# Patient Record
Sex: Male | Born: 1950
Health system: Southern US, Community
[De-identification: ages and names within clinical notes are randomized; demographics above are authoritative.]

## PROBLEM LIST (undated history)

## (undated) DIAGNOSIS — E785 Hyperlipidemia, unspecified: Secondary | ICD-10-CM

## (undated) DIAGNOSIS — R531 Weakness: Secondary | ICD-10-CM

## (undated) DIAGNOSIS — I739 Peripheral vascular disease, unspecified: Secondary | ICD-10-CM

## (undated) DIAGNOSIS — M1990 Unspecified osteoarthritis, unspecified site: Secondary | ICD-10-CM

## (undated) DIAGNOSIS — E119 Type 2 diabetes mellitus without complications: Secondary | ICD-10-CM

## (undated) DIAGNOSIS — I6529 Occlusion and stenosis of unspecified carotid artery: Secondary | ICD-10-CM

## (undated) DIAGNOSIS — F419 Anxiety disorder, unspecified: Secondary | ICD-10-CM

## (undated) DIAGNOSIS — M542 Cervicalgia: Secondary | ICD-10-CM

## (undated) DIAGNOSIS — I10 Essential (primary) hypertension: Secondary | ICD-10-CM

## (undated) DIAGNOSIS — R7303 Prediabetes: Secondary | ICD-10-CM

## (undated) DIAGNOSIS — I6522 Occlusion and stenosis of left carotid artery: Secondary | ICD-10-CM

## (undated) HISTORY — DX: Hyperlipidemia, unspecified: E78.5

## (undated) HISTORY — PX: BACK SURGERY: SHX140

## (undated) HISTORY — PX: INGUINAL HERNIA REPAIR: SUR1180

## (undated) HISTORY — DX: Essential (primary) hypertension: I10

## (undated) HISTORY — DX: Occlusion and stenosis of unspecified carotid artery: I65.29

---

## 2000-07-16 ENCOUNTER — Encounter: Admission: RE | Admit: 2000-07-16 | Discharge: 2000-10-14 | Payer: Self-pay | Admitting: Internal Medicine

## 2005-02-09 ENCOUNTER — Ambulatory Visit (HOSPITAL_COMMUNITY): Admission: RE | Admit: 2005-02-09 | Discharge: 2005-02-09 | Payer: Self-pay | Admitting: Surgery

## 2005-02-09 ENCOUNTER — Ambulatory Visit (HOSPITAL_BASED_OUTPATIENT_CLINIC_OR_DEPARTMENT_OTHER): Admission: RE | Admit: 2005-02-09 | Discharge: 2005-02-09 | Payer: Self-pay | Admitting: Surgery

## 2009-09-29 ENCOUNTER — Encounter: Payer: Self-pay | Admitting: Cardiovascular Disease

## 2010-06-19 ENCOUNTER — Encounter: Payer: Self-pay | Admitting: Orthopedic Surgery

## 2010-10-14 NOTE — Op Note (Signed)
NAMECRIMSON, BEER               ACCOUNT NO.:  [id number]   MEDICAL RECORD NO.:  [id number]          PATIENT TYPE:  AMB   LOCATION:  DSC                          FACILITY:  [facility]   PHYSICIAN:  Currie Paris, M.D.DATE OF BIRTH:  1951-01-01   DATE OF PROCEDURE:  02/09/2005  DATE OF DISCHARGE:                                 OPERATIVE REPORT   OFFICE MEDICAL RECORD NUMBER:  [id number].   PREOPERATIVE DIAGNOSIS:  Left inguinal hernia.   POSTOPERATIVE DIAGNOSIS:  Left inguinal hernia.   OPERATION:  Repair of left inguinal hernia.   SURGEON:  Currie Paris, M.D.   ANESTHESIA:  MAC.   CLINICAL HISTORY:  This is a 59 year old gentleman who has presented with a  symptomatic left inguinal hernia and desired to have this repaired.   DESCRIPTION OF PROCEDURE:  The patient was seen in the holding area and had  no further questions.  The left inguinal area was marked by the patient and  by myself as the operative site.   The patient was taken to operating room and he had been given IV sedation.  The left groin area was clipped, prepped and draped.  The time-out occurred.   I infiltrated a combination of 0.05% plain Marcaine and 1% Xylocaine with  epinephrine.  We infiltrated the incision line and then below the fascia  near the anterosuperior iliac spine.   The incision was made obliquely and taken down to the external oblique with  additional local infiltrate as we got to the deeper layers.   The external oblique was opened in the line of its fibers into the  superficial ring.  The ilioinguinal nerve was identified.  The cord was  dissected up off the inguinal floor and the nerve kept with the cord  structures.   There was no direct defect present, although the floor was attenuated.  There was a sac which was readily identified in the anteromedial aspect of  the cord.  This was teased out of the cord and opened and stripped down to  the base.  There is a small piece of  small bowel stuck out in this which I  reduced.  The hernia sac was suture-closed with a pursestring of 2-0 silk  and reduced neatly under the deep ring.   I then put a Marlex mesh patch on.  This was sutured on starting to overlap  the pubic tubercle and running along from medial-to-lateral along the  inferior aspect of the transversalis and shelving edge.  The mesh was split  to go around the cord.  It was tacked medially onto the internal oblique.  The tails were crossed where it went around the deep ring and tacked  together.   This appeared to produce a nice repair with no tension.  Everything appeared  to be dry.   The incision was closed with 3-0 Vicryl on the external oblique, 3-0 Vicryl  on Scarpa's, and 4-0 Monocryl subcuticular plus Dermabond.  The patient  tolerated the procedure well.  There were no operative complications.  All  counts were correct.  Currie Paris, M.D.  Electronically Signed     CJS/MEDQ  D:  02/09/2005  T:  02/10/2005  Job:  [id number]

## 2011-10-25 ENCOUNTER — Encounter: Payer: Self-pay | Admitting: *Deleted

## 2013-06-18 ENCOUNTER — Other Ambulatory Visit: Payer: Self-pay | Admitting: Neurological Surgery

## 2013-06-25 ENCOUNTER — Encounter (HOSPITAL_COMMUNITY): Payer: Self-pay | Admitting: Pharmacy Technician

## 2013-07-01 ENCOUNTER — Encounter (HOSPITAL_COMMUNITY)
Admission: RE | Admit: 2013-07-01 | Discharge: 2013-07-01 | Disposition: A | Discharge status: Home / Self Care | Payer: 59 | Source: Ambulatory Visit | Attending: Neurological Surgery | Admitting: Neurological Surgery

## 2013-07-01 ENCOUNTER — Encounter (HOSPITAL_COMMUNITY): Payer: Self-pay

## 2013-07-01 HISTORY — DX: Unspecified osteoarthritis, unspecified site: M19.90

## 2013-07-01 HISTORY — DX: Anxiety disorder, unspecified: F41.9

## 2013-07-01 HISTORY — DX: Weakness: R53.1

## 2013-07-01 HISTORY — DX: Cervicalgia: M54.2

## 2013-07-01 LAB — CBC WITH DIFFERENTIAL/PLATELET
Basophils Absolute: 0 10*3/uL (ref 0.0–0.1)
Basophils Relative: 0 % (ref 0–1)
EOS ABS: 0.1 10*3/uL (ref 0.0–0.7)
EOS PCT: 2 % (ref 0–5)
HEMATOCRIT: 38.8 % — AB (ref 39.0–52.0)
Hemoglobin: 13.8 g/dL (ref 13.0–17.0)
LYMPHS ABS: 2.9 10*3/uL (ref 0.7–4.0)
Lymphocytes Relative: 50 % — ABNORMAL HIGH (ref 12–46)
MCH: 30.5 pg (ref 26.0–34.0)
MCHC: 35.6 g/dL (ref 30.0–36.0)
MCV: 85.7 fL (ref 78.0–100.0)
MONO ABS: 0.3 10*3/uL (ref 0.1–1.0)
MONOS PCT: 6 % (ref 3–12)
Neutro Abs: 2.5 10*3/uL (ref 1.7–7.7)
Neutrophils Relative %: 43 % (ref 43–77)
Platelets: 201 10*3/uL (ref 150–400)
RBC: 4.53 MIL/uL (ref 4.22–5.81)
RDW: 12.6 % (ref 11.5–15.5)
WBC: 5.9 10*3/uL (ref 4.0–10.5)

## 2013-07-01 LAB — COMPREHENSIVE METABOLIC PANEL
ALT: 23 U/L (ref 0–53)
AST: 24 U/L (ref 0–37)
Albumin: 4.3 g/dL (ref 3.5–5.2)
Alkaline Phosphatase: 57 U/L (ref 39–117)
BUN: 17 mg/dL (ref 6–23)
CALCIUM: 9.7 mg/dL (ref 8.4–10.5)
CO2: 23 meq/L (ref 19–32)
CREATININE: 0.72 mg/dL (ref 0.50–1.35)
Chloride: 100 mEq/L (ref 96–112)
GFR calc Af Amer: >90 mL/min (ref >90)
GFR calc non Af Amer: >90 mL/min (ref >90)
GLUCOSE: 101 mg/dL — AB (ref 70–99)
Potassium: 3.9 mEq/L (ref 3.7–5.3)
SODIUM: 138 meq/L (ref 137–147)
TOTAL PROTEIN: 7.4 g/dL (ref 6.0–8.3)
Total Bilirubin: 0.3 mg/dL (ref 0.3–1.2)

## 2013-07-01 LAB — SURGICAL PCR SCREEN
MRSA, PCR: NEGATIVE
Staphylococcus aureus: NEGATIVE

## 2013-07-01 NOTE — Progress Notes (Addendum)
Pt doesn't have a cardiologist  Stress test report in epic under encounter with [facility]  Denies ever having a heart cath  Medical Md is Dr.John Laurann Montana  Denies EKG or CXR in past yr   Echo report in epic from 2011

## 2013-07-01 NOTE — Pre-Procedure Instructions (Signed)
EAN GETTEL  07/01/2013   Your procedure is scheduled on:  Fri, Feb 6 @ 11:20 AM  Report to [facility]  at 9:15 AM.  Call this number if you have problems the morning of surgery: [phone]   Remember:   Do not eat food or drink liquids after midnight.      Do not wear jewelry, make-up or nail polish.  Do not wear lotions, powders, or perfumes. You may wear deodorant.  Do not shave 48 hours prior to surgery. Men may shave face and neck.  Do not bring valuables to the hospital.  [facility] is not responsible                  for any belongings or valuables.               Contacts, dentures or bridgework may not be worn into surgery.  Leave suitcase in the car. After surgery it may be brought to your room.  For patients admitted to the hospital, discharge time is determined by your                treatment team.               Patients discharged the day of surgery will not be allowed to drive  home.    Special Instructions: Shower using CHG 2 nights before surgery and the night before surgery.  If you shower the day of surgery use CHG.  Use special wash - you have one bottle of CHG for all showers.  You should use approximately 1/3 of the bottle for each shower.   Please read over the following fact sheets that you were given: Pain Booklet, Coughing and Deep Breathing, MRSA Information and Surgical Site Infection Prevention

## 2013-07-02 MED ORDER — CEFAZOLIN SODIUM-DEXTROSE 2-3 GM-% IV SOLR
2.0000 g | INTRAVENOUS | Status: AC
  Administered 2013-07-03: 2 g via INTRAVENOUS
  Filled 2013-07-02: qty 50

## 2013-07-02 MED ORDER — DEXAMETHASONE SODIUM PHOSPHATE 10 MG/ML IJ SOLN
10.0000 mg | INTRAMUSCULAR | Status: AC
  Administered 2013-07-03: 10 mg via INTRAVENOUS
  Filled 2013-07-02: qty 1

## 2013-07-03 ENCOUNTER — Ambulatory Visit (HOSPITAL_COMMUNITY): Payer: 59

## 2013-07-03 ENCOUNTER — Encounter (HOSPITAL_COMMUNITY)
Admission: RE | Disposition: A | Discharge status: Home / Self Care | Payer: Self-pay | Source: Ambulatory Visit | Attending: Neurological Surgery

## 2013-07-03 ENCOUNTER — Encounter (HOSPITAL_COMMUNITY): Payer: Self-pay | Admitting: Certified Registered Nurse Anesthetist

## 2013-07-03 ENCOUNTER — Ambulatory Visit (HOSPITAL_COMMUNITY)
Admission: RE | Admit: 2013-07-03 | Discharge: 2013-07-04 | Disposition: A | Discharge status: Home / Self Care | Payer: 59 | Source: Ambulatory Visit | Attending: Neurological Surgery | Admitting: Neurological Surgery

## 2013-07-03 ENCOUNTER — Ambulatory Visit (HOSPITAL_COMMUNITY): Payer: 59 | Admitting: Certified Registered Nurse Anesthetist

## 2013-07-03 ENCOUNTER — Encounter (HOSPITAL_COMMUNITY): Payer: 59 | Admitting: Certified Registered Nurse Anesthetist

## 2013-07-03 DIAGNOSIS — E119 Type 2 diabetes mellitus without complications: Secondary | ICD-10-CM | POA: Insufficient documentation

## 2013-07-03 DIAGNOSIS — Z981 Arthrodesis status: Secondary | ICD-10-CM

## 2013-07-03 DIAGNOSIS — F411 Generalized anxiety disorder: Secondary | ICD-10-CM | POA: Insufficient documentation

## 2013-07-03 DIAGNOSIS — E785 Hyperlipidemia, unspecified: Secondary | ICD-10-CM | POA: Insufficient documentation

## 2013-07-03 DIAGNOSIS — Z87891 Personal history of nicotine dependence: Secondary | ICD-10-CM | POA: Insufficient documentation

## 2013-07-03 DIAGNOSIS — I10 Essential (primary) hypertension: Secondary | ICD-10-CM | POA: Insufficient documentation

## 2013-07-03 DIAGNOSIS — M4802 Spinal stenosis, cervical region: Secondary | ICD-10-CM | POA: Insufficient documentation

## 2013-07-03 DIAGNOSIS — M4712 Other spondylosis with myelopathy, cervical region: Secondary | ICD-10-CM | POA: Insufficient documentation

## 2013-07-03 HISTORY — PX: ANTERIOR CERVICAL CORPECTOMY: SHX1159

## 2013-07-03 LAB — GLUCOSE, CAPILLARY
Glucose-Capillary: 146 mg/dL — ABNORMAL HIGH (ref 70–99)
Glucose-Capillary: 199 mg/dL — ABNORMAL HIGH (ref 70–99)

## 2013-07-03 SURGERY — ANTERIOR CERVICAL CORPECTOMY
Anesthesia: General

## 2013-07-03 MED ORDER — ROCURONIUM BROMIDE 100 MG/10ML IV SOLN
INTRAVENOUS | Status: DC | PRN
  Administered 2013-07-03: 50 mg via INTRAVENOUS

## 2013-07-03 MED ORDER — THROMBIN 20000 UNITS EX SOLR
CUTANEOUS | Status: DC | PRN
  Administered 2013-07-03: 13:00:00 via TOPICAL

## 2013-07-03 MED ORDER — BUPIVACAINE HCL (PF) 0.25 % IJ SOLN
INTRAMUSCULAR | Status: DC | PRN
  Administered 2013-07-03: 3 mL

## 2013-07-03 MED ORDER — 0.9 % SODIUM CHLORIDE (POUR BTL) OPTIME
TOPICAL | Status: DC | PRN
  Administered 2013-07-03: 1000 mL

## 2013-07-03 MED ORDER — POTASSIUM CHLORIDE IN NACL 20-0.9 MEQ/L-% IV SOLN
INTRAVENOUS | Status: DC
  Filled 2013-07-03 (×3): qty 1000

## 2013-07-03 MED ORDER — FENTANYL CITRATE 0.05 MG/ML IJ SOLN
INTRAMUSCULAR | Status: AC
  Filled 2013-07-03: qty 5

## 2013-07-03 MED ORDER — SODIUM CHLORIDE 0.9 % IJ SOLN
3.0000 mL | Freq: Two times a day (BID) | INTRAMUSCULAR | Status: DC
  Administered 2013-07-03: 3 mL via INTRAVENOUS

## 2013-07-03 MED ORDER — PHENOL 1.4 % MT LIQD
1.0000 | OROMUCOSAL | Status: DC | PRN
  Filled 2013-07-03: qty 177

## 2013-07-03 MED ORDER — MENTHOL 3 MG MT LOZG: 1.0000 | LOZENGE | OROMUCOSAL | Status: DC | PRN

## 2013-07-03 MED ORDER — LIDOCAINE HCL (CARDIAC) 20 MG/ML IV SOLN
INTRAVENOUS | Status: AC
  Filled 2013-07-03: qty 10

## 2013-07-03 MED ORDER — LIDOCAINE HCL 4 % MT SOLN
OROMUCOSAL | Status: DC | PRN
  Administered 2013-07-03: 4 mL via TOPICAL

## 2013-07-03 MED ORDER — GLYCOPYRROLATE 0.2 MG/ML IJ SOLN
INTRAMUSCULAR | Status: AC
  Filled 2013-07-03: qty 2

## 2013-07-03 MED ORDER — SODIUM CHLORIDE 0.9 % IJ SOLN: 3.0000 mL | INTRAMUSCULAR | Status: DC | PRN

## 2013-07-03 MED ORDER — FENTANYL CITRATE 0.05 MG/ML IJ SOLN
INTRAMUSCULAR | Status: DC | PRN
  Administered 2013-07-03: 50 ug via INTRAVENOUS
  Administered 2013-07-03: 100 ug via INTRAVENOUS
  Administered 2013-07-03 (×3): 50 ug via INTRAVENOUS
  Administered 2013-07-03: 150 ug via INTRAVENOUS
  Administered 2013-07-03: 50 ug via INTRAVENOUS

## 2013-07-03 MED ORDER — ONDANSETRON HCL 4 MG/2ML IJ SOLN: 4.0000 mg | Freq: Once | INTRAMUSCULAR | Status: DC | PRN

## 2013-07-03 MED ORDER — IRBESARTAN 150 MG PO TABS
150.0000 mg | ORAL_TABLET | Freq: Every day | ORAL | Status: DC
  Administered 2013-07-03: 150 mg via ORAL
  Filled 2013-07-03 (×3): qty 1

## 2013-07-03 MED ORDER — LIDOCAINE HCL (CARDIAC) 20 MG/ML IV SOLN
INTRAVENOUS | Status: DC | PRN
Start: 2013-07-03 — End: 2013-07-03
  Administered 2013-07-03: 100 mg via INTRAVENOUS

## 2013-07-03 MED ORDER — ARTIFICIAL TEARS OP OINT
TOPICAL_OINTMENT | OPHTHALMIC | Status: DC | PRN
  Administered 2013-07-03: 1 via OPHTHALMIC

## 2013-07-03 MED ORDER — ACETAMINOPHEN 325 MG PO TABS: 650.0000 mg | ORAL_TABLET | ORAL | Status: DC | PRN

## 2013-07-03 MED ORDER — GLYCOPYRROLATE 0.2 MG/ML IJ SOLN
INTRAMUSCULAR | Status: DC | PRN
  Administered 2013-07-03: 0.4 mg via INTRAVENOUS

## 2013-07-03 MED ORDER — ARTIFICIAL TEARS OP OINT
TOPICAL_OINTMENT | OPHTHALMIC | Status: AC
  Filled 2013-07-03: qty 3.5

## 2013-07-03 MED ORDER — SODIUM CHLORIDE 0.9 % IR SOLN
Status: DC | PRN
  Administered 2013-07-03: 13:00:00

## 2013-07-03 MED ORDER — DEXAMETHASONE SODIUM PHOSPHATE 4 MG/ML IJ SOLN
4.0000 mg | Freq: Four times a day (QID) | INTRAMUSCULAR | Status: DC
  Filled 2013-07-03 (×4): qty 1

## 2013-07-03 MED ORDER — ROCURONIUM BROMIDE 50 MG/5ML IV SOLN
INTRAVENOUS | Status: AC
  Filled 2013-07-03: qty 1

## 2013-07-03 MED ORDER — DEXAMETHASONE 4 MG PO TABS
4.0000 mg | ORAL_TABLET | Freq: Four times a day (QID) | ORAL | Status: DC
  Administered 2013-07-03 – 2013-07-04 (×3): 4 mg via ORAL
  Filled 2013-07-03 (×5): qty 1

## 2013-07-03 MED ORDER — LACTATED RINGERS IV SOLN
INTRAVENOUS | Status: DC
  Administered 2013-07-03: 50 mL/h via INTRAVENOUS
  Administered 2013-07-03: 13:00:00 via INTRAVENOUS

## 2013-07-03 MED ORDER — ONDANSETRON HCL 4 MG/2ML IJ SOLN: 4.0000 mg | INTRAMUSCULAR | Status: DC | PRN

## 2013-07-03 MED ORDER — PHENYLEPHRINE HCL 10 MG/ML IJ SOLN
INTRAMUSCULAR | Status: DC | PRN
  Administered 2013-07-03 (×5): 40 ug via INTRAVENOUS

## 2013-07-03 MED ORDER — NEOSTIGMINE METHYLSULFATE 1 MG/ML IJ SOLN
INTRAMUSCULAR | Status: DC | PRN
  Administered 2013-07-03: 3 mg via INTRAVENOUS

## 2013-07-03 MED ORDER — ALBUMIN HUMAN 5 % IV SOLN
INTRAVENOUS | Status: DC | PRN
  Administered 2013-07-03: 14:00:00 via INTRAVENOUS

## 2013-07-03 MED ORDER — PROPOFOL 10 MG/ML IV BOLUS
INTRAVENOUS | Status: AC
  Filled 2013-07-03: qty 20

## 2013-07-03 MED ORDER — MORPHINE SULFATE 2 MG/ML IJ SOLN
1.0000 mg | INTRAMUSCULAR | Status: DC | PRN
  Administered 2013-07-03: 2 mg via INTRAVENOUS
  Filled 2013-07-03: qty 1

## 2013-07-03 MED ORDER — OLMESARTAN MEDOXOMIL-HCTZ 20-12.5 MG PO TABS: 1.0000 | ORAL_TABLET | Freq: Every day | ORAL | Status: DC

## 2013-07-03 MED ORDER — THROMBIN 5000 UNITS EX SOLR
OROMUCOSAL | Status: DC | PRN
  Administered 2013-07-03: 13:00:00 via TOPICAL

## 2013-07-03 MED ORDER — ONDANSETRON HCL 4 MG/2ML IJ SOLN
INTRAMUSCULAR | Status: DC | PRN
  Administered 2013-07-03: 4 mg via INTRAVENOUS

## 2013-07-03 MED ORDER — HYDROCODONE-ACETAMINOPHEN 5-325 MG PO TABS
1.0000 | ORAL_TABLET | ORAL | Status: DC | PRN
  Administered 2013-07-03 (×2): 2 via ORAL
  Filled 2013-07-03 (×2): qty 2

## 2013-07-03 MED ORDER — HYDROCHLOROTHIAZIDE 12.5 MG PO CAPS
12.5000 mg | ORAL_CAPSULE | Freq: Every day | ORAL | Status: DC
  Administered 2013-07-03: 12.5 mg via ORAL
  Filled 2013-07-03 (×2): qty 1

## 2013-07-03 MED ORDER — PROPOFOL 10 MG/ML IV BOLUS
INTRAVENOUS | Status: DC | PRN
  Administered 2013-07-03: 70 mg via INTRAVENOUS
  Administered 2013-07-03: 200 mg via INTRAVENOUS

## 2013-07-03 MED ORDER — MIDAZOLAM HCL 2 MG/2ML IJ SOLN
INTRAMUSCULAR | Status: AC
  Filled 2013-07-03: qty 2

## 2013-07-03 MED ORDER — METHOCARBAMOL 500 MG PO TABS
500.0000 mg | ORAL_TABLET | Freq: Four times a day (QID) | ORAL | Status: DC | PRN
  Administered 2013-07-03: 500 mg via ORAL
  Filled 2013-07-03: qty 1

## 2013-07-03 MED ORDER — CEFAZOLIN SODIUM 1-5 GM-% IV SOLN
1.0000 g | Freq: Three times a day (TID) | INTRAVENOUS | Status: AC
  Administered 2013-07-03 – 2013-07-04 (×2): 1 g via INTRAVENOUS
  Filled 2013-07-03 (×2): qty 50

## 2013-07-03 MED ORDER — METHOCARBAMOL 100 MG/ML IJ SOLN
500.0000 mg | Freq: Four times a day (QID) | INTRAVENOUS | Status: DC | PRN
  Filled 2013-07-03: qty 5

## 2013-07-03 MED ORDER — MIDAZOLAM HCL 5 MG/5ML IJ SOLN
INTRAMUSCULAR | Status: DC | PRN
  Administered 2013-07-03: 2 mg via INTRAVENOUS

## 2013-07-03 MED ORDER — ONDANSETRON HCL 4 MG/2ML IJ SOLN
INTRAMUSCULAR | Status: AC
  Filled 2013-07-03: qty 2

## 2013-07-03 MED ORDER — ACETAMINOPHEN 650 MG RE SUPP: 650.0000 mg | RECTAL | Status: DC | PRN

## 2013-07-03 MED ORDER — SODIUM CHLORIDE 0.9 % IV SOLN: 250.0000 mL | INTRAVENOUS | Status: DC

## 2013-07-03 MED ORDER — HYDROMORPHONE HCL PF 1 MG/ML IJ SOLN: 0.2500 mg | INTRAMUSCULAR | Status: DC | PRN

## 2013-07-03 SURGICAL SUPPLY — 53 items
BAG DECANTER FOR FLEXI CONT (MISCELLANEOUS) ×3 IMPLANT
BENZOIN TINCTURE PRP APPL 2/3 (GAUZE/BANDAGES/DRESSINGS) ×3 IMPLANT
BONE MATRIX OSTEOCEL PRO SM (Bone Implant) ×3 IMPLANT
BUR MATCHSTICK NEURO 3.0 LAGG (BURR) ×3 IMPLANT
CAGE LORDOTIC 8 SM PLUS (Cage) ×3 IMPLANT
CAGE SMALL 7X13X15 (Cage) ×3 IMPLANT
CANISTER SUCT 3000ML (MISCELLANEOUS) ×3 IMPLANT
CLOSURE WOUND 1/2 X4 (GAUZE/BANDAGES/DRESSINGS) ×1
CONT SPEC 4OZ CLIKSEAL STRL BL (MISCELLANEOUS) ×3 IMPLANT
DRAPE C-ARM 42X72 X-RAY (DRAPES) ×6 IMPLANT
DRAPE LAPAROTOMY 100X72 PEDS (DRAPES) ×3 IMPLANT
DRAPE MICROSCOPE ZEISS OPMI (DRAPES) ×3 IMPLANT
DRAPE POUCH INSTRU U-SHP 10X18 (DRAPES) ×3 IMPLANT
DRESSING TELFA 8X3 (GAUZE/BANDAGES/DRESSINGS) IMPLANT
DRILL BIT HELIX (BIT) ×3 IMPLANT
DRSG OPSITE 4X5.5 SM (GAUZE/BANDAGES/DRESSINGS) IMPLANT
DRSG OPSITE POSTOP 3X4 (GAUZE/BANDAGES/DRESSINGS) ×3 IMPLANT
DURAPREP 6ML APPLICATOR 50/CS (WOUND CARE) ×3 IMPLANT
ELECT COATED BLADE 2.86 ST (ELECTRODE) ×3 IMPLANT
ELECT REM PT RETURN 9FT ADLT (ELECTROSURGICAL) ×3
ELECTRODE REM PT RTRN 9FT ADLT (ELECTROSURGICAL) ×1 IMPLANT
GAUZE SPONGE 4X4 16PLY XRAY LF (GAUZE/BANDAGES/DRESSINGS) IMPLANT
GLOVE BIO SURGEON STRL SZ8 (GLOVE) ×3 IMPLANT
GLOVE BIO SURGEON STRL SZ8.5 (GLOVE) ×3 IMPLANT
GLOVE BIOGEL PI IND STRL 7.0 (GLOVE) ×1 IMPLANT
GLOVE BIOGEL PI INDICATOR 7.0 (GLOVE) ×2
GLOVE SS BIOGEL STRL SZ 8 (GLOVE) ×1 IMPLANT
GLOVE SUPERSENSE BIOGEL SZ 8 (GLOVE) ×2
GLOVE SURG SS PI 7.0 STRL IVOR (GLOVE) ×6 IMPLANT
GOWN BRE IMP SLV AUR LG STRL (GOWN DISPOSABLE) IMPLANT
GOWN BRE IMP SLV AUR XL STRL (GOWN DISPOSABLE) ×6 IMPLANT
GOWN STRL REIN 2XL LVL4 (GOWN DISPOSABLE) IMPLANT
HEMOSTAT POWDER KIT SURGIFOAM (HEMOSTASIS) ×3 IMPLANT
KIT BASIN OR (CUSTOM PROCEDURE TRAY) ×3 IMPLANT
KIT ROOM TURNOVER OR (KITS) ×3 IMPLANT
NEEDLE HYPO 25X1 1.5 SAFETY (NEEDLE) ×3 IMPLANT
NEEDLE SPNL 20GX3.5 QUINCKE YW (NEEDLE) ×3 IMPLANT
NS IRRIG 1000ML POUR BTL (IV SOLUTION) ×3 IMPLANT
PACK LAMINECTOMY NEURO (CUSTOM PROCEDURE TRAY) ×3 IMPLANT
PAD ARMBOARD 7.5X6 YLW CONV (MISCELLANEOUS) ×9 IMPLANT
PLATE ARCHON 2-LEVEL 40MM (Plate) ×3 IMPLANT
RUBBERBAND STERILE (MISCELLANEOUS) ×6 IMPLANT
SCREW ARCHON SELFDRILL 4.0X13 (Screw) ×3 IMPLANT
SCREW ARCHON SELFTAP 4.0X13 (Screw) ×15 IMPLANT
SPONGE INTESTINAL PEANUT (DISPOSABLE) ×3 IMPLANT
SPONGE SURGIFOAM ABS GEL 100 (HEMOSTASIS) ×3 IMPLANT
STRIP CLOSURE SKIN 1/2X4 (GAUZE/BANDAGES/DRESSINGS) ×2 IMPLANT
SUT VIC AB 3-0 SH 8-18 (SUTURE) ×6 IMPLANT
SYR 20ML ECCENTRIC (SYRINGE) ×3 IMPLANT
TOWEL OR 17X24 6PK STRL BLUE (TOWEL DISPOSABLE) ×3 IMPLANT
TOWEL OR 17X26 10 PK STRL BLUE (TOWEL DISPOSABLE) ×3 IMPLANT
TRAP SPECIMEN MUCOUS 40CC (MISCELLANEOUS) ×3 IMPLANT
WATER STERILE IRR 1000ML POUR (IV SOLUTION) ×3 IMPLANT

## 2013-07-03 NOTE — Anesthesia Preprocedure Evaluation (Signed)
Anesthesia Evaluation  Patient identified by MRN, date of birth, ID band  Reviewed: Allergy & Precautions, H&P , NPO status , Patient's Chart, lab work & pertinent test results  Airway       Dental   Pulmonary former smoker,          Cardiovascular hypertension,     Neuro/Psych    GI/Hepatic   Endo/Other  diabetes, Well Controlled, Type 2  Renal/GU      Musculoskeletal   Abdominal   Peds  Hematology   Anesthesia Other Findings   Reproductive/Obstetrics                           Anesthesia Physical Anesthesia Plan  ASA: II  Anesthesia Plan: General   Post-op Pain Management:    Induction: Intravenous  Airway Management Planned: Oral ETT  Additional Equipment:   Intra-op Plan:   Post-operative Plan: Extubation in OR  Informed Consent: I have reviewed the patients History and Physical, chart, labs and discussed the procedure including the risks, benefits and alternatives for the proposed anesthesia with the patient or authorized representative who has indicated his/her understanding and acceptance.     Plan Discussed with:   Anesthesia Plan Comments:         Anesthesia Quick Evaluation

## 2013-07-03 NOTE — Transfer of Care (Signed)
Immediate Anesthesia Transfer of Care Note  Patient: Gregg Moon  Procedure(s) Performed: Procedure(s): C/5 Corpectomy w/strut graft + plating (N/A)  Patient Location: PACU  Anesthesia Type:General  Level of Consciousness: awake, alert , patient cooperative and responds to stimulation  Airway & Oxygen Therapy: Patient Spontanous Breathing and Patient connected to nasal cannula oxygen  Post-op Assessment: Report given to PACU RN, Post -op Vital signs reviewed and stable and Patient moving all extremities X 4  Post vital signs: Reviewed and stable  Complications: No apparent anesthesia complications

## 2013-07-03 NOTE — Anesthesia Postprocedure Evaluation (Signed)
Anesthesia Post Note  Patient: Gregg Moon  Procedure(s) Performed: Procedure(s) (LRB): C/5 Corpectomy w/strut graft + plating (N/A)  Anesthesia type: general  Patient location: PACU  Post pain: Pain level controlled  Post assessment: Patient's Cardiovascular Status Stable Post vital signs: Reviewed and stable  Level of consciousness: sedated  Complications: No apparent anesthesia complications

## 2013-07-03 NOTE — Preoperative (Signed)
Beta Blockers   Reason not to administer Beta Blockers:Not Applicable 

## 2013-07-03 NOTE — Op Note (Signed)
07/03/2013  3:04 PM  PATIENT:  Gregg Moon  63 y.o. male  PRE-OPERATIVE DIAGNOSIS:  Cervical spondylosis with severe cervical spinal stenosis and early myelopathy  POST-OPERATIVE DIAGNOSIS:  Same  PROCEDURE:  1. Decompressive anterior cervical discectomy C4-5 and C5-6, 2. Anterior cervical arthrodesis C4-5 and C5-6 utilizing peek interbody cages packed with local autograft and morcellized allograft, 3 anterior cervical plating C4-C6 utilizing an Nuvasive Archon plate  SURGEON:  Sherley Bounds, MD  ASSISTANTS: Dr. Arnoldo Morale  ANESTHESIA:   General  EBL: 150 ml  Total I/O In: 1850 [I.V.:1600; IV Piggyback:250] Out: 150 [Blood:150]  BLOOD ADMINISTERED:none  DRAINS: None   SPECIMEN:  No Specimen  INDICATION FOR PROCEDURE: This patient presented with neck pain and numbness in his left hand. MRI showed severe spinal stenosis with kyphosis at C4-5 and C5-6 with signal change in the spinal cord. I recommended either C5 corpectomy vs a two level discectomy at C4-5 and C5-6 in order to decompress the canal. He understood the risks benefits suspected outcomes and wished proceed prescription Patient understood the risks, benefits, and alternatives and potential outcomes and wished to proceed.  PROCEDURE DETAILS: Patient was brought to the operating room placed under general endotracheal anesthesia. Patient was placed in the supine position on the operating room table. The neck was prepped with Duraprep and draped in a sterile fashion.   Three cc of local anesthesia was injected and a transverse incision was made on the right side of the neck.  Dissection was carried down thru the subcutaneous tissue and the platysma was  elevated, opened, and undermined with Metzenbaum scissors.  Dissection was then carried out thru an avascular plane leaving the sternocleidomastoid carotid artery and jugular vein laterally and the trachea and esophagus medially. The ventral aspect of the vertebral column was  identified and a localizing x-ray was taken. The C5-6 level was identified. The longus colli muscles were then elevated and the retractor was placed expose C4-5 and C5-6. The annulus of C4-5 and C5-6 was incised and the disc space entered. Discectomy was performed with micro-curettes and pituitary rongeurs. I then used the high-speed drill to drill the endplates down to the level of the posterior longitudinal ligament. The drill shavings were saved in a mucous trap for later arthrodesis. The operating microscope was draped and brought into the field provided additional magnification, illumination and visualization. Discectomy was continued posteriorly thru the disc space at both levels. Posterior longitudinal ligament was opened with a nerve hook, and then removed along with disc herniation and osteophytes, decompressing the spinal canal and thecal sac. We then continued to remove osteophytic overgrowth and disc material decompressing the neural foramina and exiting nerve roots bilaterally. The scope was angled up and down to help decompress and undercut the vertebral bodies. I undercut the vertebral bodies as best I could and to the dura was full and relaxed all the way across and I could see the cord pulsatile through the dura. Once the decompression was completed we could pass a nerve hook circumferentially to assure adequate decompression in the midline and in the neural foramina. So by both visualization and palpation we felt we had an adequate decompression of the neural elements. We then measured the height of the intravertebral disc space and selected a 8 millimeter Peek interbody cage packed with autograft and morcellized allograft. It was then gently positioned in the intravertebral disc space and countersunk at both levels. I then used a Nuvasive archon plate and placed four variable angle screws into  the vertebral bodies and locked them into position. The wound was irrigated with bacitracin solution,  checked for hemostasis which was established and confirmed. Once meticulous hemostasis was achieved, we then proceeded with closure. The platysma was closed with interrupted 3-0 undyed Vicryl suture, the subcuticular layer was closed with interrupted 3-0 undyed Vicryl suture. The skin edges were approximated with steristrips. The drapes were removed. A sterile dressing was applied. The patient was then awakened from general anesthesia and transferred to the recovery room in stable condition. At the end of the procedure all sponge, needle and instrument counts were correct.   PLAN OF CARE: Admit for overnight observation  PATIENT DISPOSITION:  PACU - hemodynamically stable.   Delay start of Pharmacological VTE agent (>24hrs) due to surgical blood loss or risk of bleeding:  yes

## 2013-07-03 NOTE — Anesthesia Procedure Notes (Signed)
Procedure Name: Intubation Date/Time: 07/03/2013 12:28 PM Performed by: [place] Pink Pre-anesthesia Checklist: Patient identified, Timeout performed, Emergency Drugs available, Suction available and Patient being monitored Patient Re-evaluated:Patient Re-evaluated prior to inductionOxygen Delivery Method: Circle system utilized Preoxygenation: Pre-oxygenation with 100% oxygen Intubation Type: IV induction Ventilation: Mask ventilation without difficulty and Oral airway inserted - appropriate to patient size Laryngoscope Size: Mac, 3, Miller and 2 Grade View: Grade III Tube type: Oral Tube size: 7.5 mm Number of attempts: 2 Airway Equipment and Method: Stylet,  Bougie stylet and LTA kit utilized Placement Confirmation: ETT inserted through vocal cords under direct vision,  positive ETCO2 and breath sounds checked- equal and bilateral Secured at: 23 cm Tube secured with: Tape Dental Injury: Teeth and Oropharynx as per pre-operative assessment

## 2013-07-03 NOTE — H&P (Signed)
Subjective:   Patient is a 63 y.o. male admitted for C4-5 C5-6 ACDF versus corpectomy for early cervical spondylitic myelopathy for cervical spondylosis and stenosis. The patient first presented to me with complaints of neck pain. Onset of symptoms was several months ago. The pain is described as aching and occurs intermittently. The pain is rated moderate, and is located  at the base of the neck and radiates to the left arm. The symptoms have been progressive. Symptoms are exacerbated by extending head backwards, and are relieved by none.  Previous work up includes MRI of cervical spine, results: spinal stenosis.  Past Medical History  Diagnosis Date  . Hyperlipidemia     takes Zocor daily  . Hypertension     takes Benicar daily  . Neck pain     stenosis  . Weakness     numbness and tingling to the left arm  . Arthritis   . Diabetes mellitus     borderline  . Anxiety     was on meds but has been off for 2-3 months    Past Surgical History  Procedure Laterality Date  . Inguinal hernia repair      x 2    Allergies  Allergen Reactions  . Other     A blood pressure pill but unsure of name-gave bad headache    History  Substance Use Topics  . Smoking status: Former Research scientist (life sciences)  . Smokeless tobacco: Not on file     Comment: quit smoking 67yrs ago  . Alcohol Use: Yes     Comment: weekends    Family History  Problem Relation Age of Onset  . Heart disease Mother   . Diabetes Mother   . Heart disease Father   . Hypertension Sister   . Diabetes Sister    Prior to Admission medications   Medication Sig Start Date End Date Taking? Authorizing Provider  Naphazoline HCl (CLEAR EYES OP) Apply 1 drop to eye daily as needed (for itching).    Yes Historical Provider, MD  olmesartan-hydrochlorothiazide (BENICAR HCT) 20-12.5 MG per tablet Take 1 tablet by mouth daily.   Yes Historical Provider, MD  sildenafil (VIAGRA) 100 MG tablet Take 100 mg by mouth daily as needed for erectile  dysfunction.   Yes Historical Provider, MD  simvastatin (ZOCOR) 40 MG tablet Take 40 mg by mouth every evening.   Yes Historical Provider, MD     Review of Systems  Positive ROS: neg  All other systems have been reviewed and were otherwise negative with the exception of those mentioned in the HPI and as above.  Objective: Vital signs in last 24 hours: Temp:  [98.1 F (36.7 C)] 98.1 F (36.7 C) (02/05 0949) Pulse Rate:  [81] 81 (02/05 0949) Resp:  [20] 20 (02/05 0949) BP: (131)/(80) 131/80 mmHg (02/05 0949) SpO2:  [98 %] 98 % (02/05 0949)  General Appearance: Alert, cooperative, no distress, appears stated age Head: Normocephalic, without obvious abnormality, atraumatic Eyes: PERRL, conjunctiva/corneas clear, EOM'Moon intact      Neck: Supple, symmetrical, trachea midline, Back: Symmetric, no curvature, ROM normal, no CVA tenderness Lungs:  respirations unlabored Heart: Regular rate and rhythm Abdomen: Soft, non-tender Extremities: Extremities normal, atraumatic, no cyanosis or edema Pulses: 2+ and symmetric all extremities Skin: Skin color, texture, turgor normal, no rashes or lesions  NEUROLOGIC:  Mental status: Alert and oriented x4, no aphasia, good attention span, fund of knowledge and memory  Motor Exam - grossly normal Sensory Exam - grossly normal Reflexes:  1+ Coordination - grossly normal Gait - grossly normal Balance - grossly normal Cranial Nerves: I: smell Not tested  II: visual acuity  OS: nl    OD: nl  II: visual fields Full to confrontation  II: pupils Equal, round, reactive to light  III,VII: ptosis None  III,IV,VI: extraocular muscles  Full ROM  V: mastication Normal  V: facial light touch sensation  Normal  V,VII: corneal reflex  Present  VII: facial muscle function - upper  Normal  VII: facial muscle function - lower Normal  VIII: hearing Not tested  IX: soft palate elevation  Normal  IX,X: gag reflex Present  XI: trapezius strength  5/5  XI:  sternocleidomastoid strength 5/5  XI: neck flexion strength  5/5  XII: tongue strength  Normal    Data Review Lab Results  Component Value Date   WBC 5.9 07/01/2013   HGB 13.8 07/01/2013   HCT 38.8* 07/01/2013   MCV 85.7 07/01/2013   PLT 201 07/01/2013   Lab Results  Component Value Date   NA 138 07/01/2013   K 3.9 07/01/2013   CL 100 07/01/2013   CO2 23 07/01/2013   BUN 17 07/01/2013   CREATININE 0.72 07/01/2013   GLUCOSE 101* 07/01/2013   No results found for this basename: INR, PROTIME    Assessment:   Cervical neck pain with herniated nucleus pulposus/ spondylosis/ stenosis at C4-5 and C5-6. Patient has failed conservative therapy. Planned surgery : ACDF with plating C4-5 C5-6 versus C5 corpectomy.  Plan:   I explained the condition and procedure to the patient and answered any questions.  Patient wishes to proceed with procedure as planned. Understands risks/ benefits/ and expected or typical outcomes.  Gregg Moon 07/03/2013 12:06 PM

## 2013-07-04 ENCOUNTER — Encounter (HOSPITAL_COMMUNITY): Payer: Self-pay | Admitting: Neurological Surgery

## 2013-07-04 LAB — GLUCOSE, CAPILLARY: Glucose-Capillary: 157 mg/dL — ABNORMAL HIGH (ref 70–99)

## 2013-07-04 MED ORDER — HYDROCODONE-ACETAMINOPHEN 5-325 MG PO TABS: 1.0000 | ORAL_TABLET | ORAL | Status: DC | PRN

## 2013-07-04 NOTE — Discharge Summary (Signed)
Physician Discharge Summary  Patient ID: Gregg Moon MRN: [id number] DOB/AGE: Jul 23, 1950 63 y.o.  Admit date: 07/03/2013 Discharge date: 07/04/2013  Admission Diagnoses: cervical stenosis   Discharge Diagnoses: same   Discharged Condition: great  Hospital Course: The patient was admitted on 07/03/2013 and taken to the operating room where the patient underwent ACDF . The patient tolerated the procedure well and was taken to the recovery room and then to the floor in stable condition. The hospital course was routine. There were no complications. The wound remained clean dry and intact. Pt had appropriate neck soreness. No complaints of arm pain or new N/T/W. The patient remained afebrile with stable vital signs, and tolerated a regular diet. The patient continued to increase activities, and pain was well controlled with oral pain medications.   Consults: None  Significant Diagnostic Studies:  Results for orders placed during the hospital encounter of 07/03/13  GLUCOSE, CAPILLARY      Result Value Range   Glucose-Capillary 146 (*) 70 - 99 mg/dL   Comment 1 Notify RN     Comment 2 Documented in Chart    GLUCOSE, CAPILLARY      Result Value Range   Glucose-Capillary 199 (*) 70 - 99 mg/dL  GLUCOSE, CAPILLARY      Result Value Range   Glucose-Capillary 157 (*) 70 - 99 mg/dL   Comment 1 Notify RN     Comment 2 Documented in Chart      Chest 2 View  07/01/2013   CLINICAL DATA:  Preoperative evaluation for cervical spine surgery, history hypertension, diabetes  EXAM: CHEST  2 VIEW  COMPARISON:  None  FINDINGS: Normal heart size, mediastinal contours, and pulmonary vascularity.  Lungs clear.  No pleural effusion or pneumothorax.  Bones unremarkable.  IMPRESSION: No acute abnormalities.   Electronically Signed   By: Lavonia Dana M.D.   On: 07/01/2013 16:58   Dg Cervical Spine 2-3 Views  07/03/2013   CLINICAL DATA:  63 year old male undergoing cervical spine surgery. Initial encounter.  EXAM:  CERVICAL SPINE - 2-3 VIEW; DG C-ARM 1-60 MIN  COMPARISON:  [facility] cervical spine MRI 05/14/2013.  FLUOROSCOPY TIME:  0 min and 8 seconds.  FINDINGS: Two intraoperative lateral views of the cervical spine. C4-C5 and C5-C6 ACDF hardware in place.  IMPRESSION: C4-C5-C5-C6 ACDF.   Electronically Signed   By: Lars Pinks M.D.   On: 07/03/2013 15:47   Dg C-arm 1-60 Min  07/03/2013   CLINICAL DATA:  63 year old male undergoing cervical spine surgery. Initial encounter.  EXAM: CERVICAL SPINE - 2-3 VIEW; DG C-ARM 1-60 MIN  COMPARISON:  [facility] cervical spine MRI 05/14/2013.  FLUOROSCOPY TIME:  0 min and 8 seconds.  FINDINGS: Two intraoperative lateral views of the cervical spine. C4-C5 and C5-C6 ACDF hardware in place.  IMPRESSION: C4-C5-C5-C6 ACDF.   Electronically Signed   By: Lars Pinks M.D.   On: 07/03/2013 15:47    Antibiotics:  Anti-infectives   Start     Dose/Rate Route Frequency Ordered Stop   07/03/13 1900  ceFAZolin (ANCEF) IVPB 1 g/50 mL premix     1 g 100 mL/hr over 30 Minutes Intravenous Every 8 hours 07/03/13 1701 07/04/13 0420   07/03/13 1304  bacitracin 50,000 Units in sodium chloride irrigation 0.9 % 500 mL irrigation  Status:  Discontinued       As needed 07/03/13 1305 07/03/13 1512   07/03/13 0600  ceFAZolin (ANCEF) IVPB 2 g/50 mL premix  2 g 100 mL/hr over 30 Minutes Intravenous On call to O.R. 07/02/13 1430 07/03/13 1231      Discharge Exam: Blood pressure 149/84, pulse 90, temperature 98.7 F (37.1 C), temperature source Oral, resp. rate 16, SpO2 92.00%. Neurologic: Grossly normal Incision CDI  Discharge Medications:     Medication List         CLEAR EYES OP  Apply 1 drop to eye daily as needed (for itching).     HYDROcodone-acetaminophen 5-325 MG per tablet  Commonly known as:  NORCO/VICODIN  Take 1-2 tablets by mouth every 4 (four) hours as needed for moderate pain.     olmesartan-hydrochlorothiazide 20-12.5  MG per tablet  Commonly known as:  BENICAR HCT  Take 1 tablet by mouth daily.     sildenafil 100 MG tablet  Commonly known as:  VIAGRA  Take 100 mg by mouth daily as needed for erectile dysfunction.     simvastatin 40 MG tablet  Commonly known as:  ZOCOR  Take 40 mg by mouth every evening.        Disposition: home   Final Dx: ACDF C4-5, C5-6      Discharge Orders   Future Orders Complete By Expires   Call MD for:  difficulty breathing, headache or visual disturbances  As directed    Call MD for:  persistant nausea and vomiting  As directed    Call MD for:  redness, tenderness, or signs of infection (pain, swelling, redness, odor or green/yellow discharge around incision site)  As directed    Call MD for:  severe uncontrolled pain  As directed    Call MD for:  temperature >100.4  As directed    Diet - low sodium heart healthy  As directed    Discharge instructions  As directed    Comments:     no driving, no strenuous activity, no heavy lifting   Increase activity slowly  As directed    Remove dressing in 24 hours  As directed       Follow-up Information   Follow up with Kayelynn Abdou S, MD. Schedule an appointment as soon as possible for a visit in 2 weeks.   Specialty:  Neurosurgery   Contact information:   [address] [place] [address] [phone]        Signed: Eustace Moore 07/04/2013, 8:53 AM

## 2013-07-04 NOTE — Progress Notes (Signed)
Pt doing very well. Pt and family given D/C instructions with Rx, verbal understanding was given. Pt D/C'd home via walking per MD order. Pt was stable @ D/C and has no other needs. Holli Humbles, RN

## 2013-07-07 LAB — GLUCOSE, CAPILLARY: Glucose-Capillary: 144 mg/dL — ABNORMAL HIGH (ref 70–99)

## 2014-12-14 ENCOUNTER — Other Ambulatory Visit: Payer: Self-pay | Admitting: Internal Medicine

## 2014-12-14 ENCOUNTER — Ambulatory Visit
Admission: RE | Admit: 2014-12-14 | Discharge: 2014-12-14 | Disposition: A | Discharge status: Home / Self Care | Payer: 59 | Source: Ambulatory Visit | Attending: Internal Medicine | Admitting: Internal Medicine

## 2014-12-14 DIAGNOSIS — R0789 Other chest pain: Secondary | ICD-10-CM

## 2016-02-10 ENCOUNTER — Other Ambulatory Visit: Payer: Self-pay | Admitting: Gastroenterology

## 2016-03-20 ENCOUNTER — Encounter (HOSPITAL_COMMUNITY): Payer: Self-pay | Admitting: *Deleted

## 2016-03-21 ENCOUNTER — Encounter (HOSPITAL_COMMUNITY): Payer: Self-pay | Admitting: Anesthesiology

## 2016-03-21 ENCOUNTER — Ambulatory Visit (HOSPITAL_COMMUNITY)
Admission: RE | Admit: 2016-03-21 | Discharge: 2016-03-21 | Disposition: A | Discharge status: Home / Self Care | Payer: 59 | Source: Ambulatory Visit | Attending: Gastroenterology | Admitting: Gastroenterology

## 2016-03-21 ENCOUNTER — Ambulatory Visit (HOSPITAL_COMMUNITY): Payer: 59 | Admitting: Anesthesiology

## 2016-03-21 ENCOUNTER — Encounter (HOSPITAL_COMMUNITY)
Admission: RE | Disposition: A | Discharge status: Home / Self Care | Payer: Self-pay | Source: Ambulatory Visit | Attending: Gastroenterology

## 2016-03-21 DIAGNOSIS — E7800 Pure hypercholesterolemia, unspecified: Secondary | ICD-10-CM | POA: Diagnosis not present

## 2016-03-21 DIAGNOSIS — D124 Benign neoplasm of descending colon: Secondary | ICD-10-CM | POA: Insufficient documentation

## 2016-03-21 DIAGNOSIS — K621 Rectal polyp: Secondary | ICD-10-CM | POA: Diagnosis not present

## 2016-03-21 DIAGNOSIS — E119 Type 2 diabetes mellitus without complications: Secondary | ICD-10-CM | POA: Insufficient documentation

## 2016-03-21 DIAGNOSIS — Z1211 Encounter for screening for malignant neoplasm of colon: Secondary | ICD-10-CM | POA: Insufficient documentation

## 2016-03-21 DIAGNOSIS — M1990 Unspecified osteoarthritis, unspecified site: Secondary | ICD-10-CM | POA: Insufficient documentation

## 2016-03-21 DIAGNOSIS — I10 Essential (primary) hypertension: Secondary | ICD-10-CM | POA: Diagnosis not present

## 2016-03-21 DIAGNOSIS — Z87891 Personal history of nicotine dependence: Secondary | ICD-10-CM | POA: Insufficient documentation

## 2016-03-21 HISTORY — DX: Prediabetes: R73.03

## 2016-03-21 HISTORY — PX: COLONOSCOPY WITH PROPOFOL: SHX5780

## 2016-03-21 LAB — GLUCOSE, CAPILLARY: GLUCOSE-CAPILLARY: 103 mg/dL — AB (ref 65–99)

## 2016-03-21 SURGERY — COLONOSCOPY WITH PROPOFOL
Anesthesia: Monitor Anesthesia Care

## 2016-03-21 MED ORDER — SODIUM CHLORIDE 0.9 % IV SOLN: INTRAVENOUS | Status: DC

## 2016-03-21 MED ORDER — PROPOFOL 10 MG/ML IV BOLUS
INTRAVENOUS | Status: DC | PRN
  Administered 2016-03-21: 20 mg via INTRAVENOUS
  Administered 2016-03-21: 10 mg via INTRAVENOUS
  Administered 2016-03-21: 20 mg via INTRAVENOUS
  Administered 2016-03-21: 10 mg via INTRAVENOUS
  Administered 2016-03-21 (×3): 20 mg via INTRAVENOUS
  Administered 2016-03-21 (×2): 10 mg via INTRAVENOUS
  Administered 2016-03-21: 20 mg via INTRAVENOUS
  Administered 2016-03-21 (×2): 10 mg via INTRAVENOUS
  Administered 2016-03-21 (×3): 20 mg via INTRAVENOUS
  Administered 2016-03-21: 40 mg via INTRAVENOUS
  Administered 2016-03-21: 30 mg via INTRAVENOUS
  Administered 2016-03-21 (×2): 20 mg via INTRAVENOUS
  Administered 2016-03-21: 10 mg via INTRAVENOUS
  Administered 2016-03-21: 20 mg via INTRAVENOUS
  Administered 2016-03-21: 10 mg via INTRAVENOUS
  Administered 2016-03-21: 20 mg via INTRAVENOUS
  Administered 2016-03-21: 10 mg via INTRAVENOUS

## 2016-03-21 MED ORDER — PROPOFOL 10 MG/ML IV BOLUS
INTRAVENOUS | Status: AC
  Filled 2016-03-21: qty 20

## 2016-03-21 MED ORDER — PROPOFOL 10 MG/ML IV BOLUS
INTRAVENOUS | Status: AC
  Filled 2016-03-21: qty 40

## 2016-03-21 MED ORDER — LACTATED RINGERS IV SOLN
INTRAVENOUS | Status: DC
  Administered 2016-03-21: 1000 mL via INTRAVENOUS

## 2016-03-21 SURGICAL SUPPLY — 21 items

## 2016-03-21 NOTE — Discharge Instructions (Signed)

## 2016-03-21 NOTE — H&P (Signed)
Procedure: Screening colonoscopy. Normal screening colonoscopy was performed on 03/12/2006  History: The patient is a 65 year old male born 21-Jun-1950. He is scheduled to undergo a repeat screening colonoscopy today.  Past medical history: Hypertension. Type 2 diabetes mellitus. Hypercholesterolemia. Bilateral inguinal hernia repairs. Cervical spine surgery.  Medication allergies: Altace cause angioedema  Exam: The patient is alert and lying comfortably on the endoscopy stretcher. Abdomen is soft and nontender to palpation. Lungs are clear to auscultation. Cardiac exam reveals a regular rhythm.  Plan: Proceed with screening colonoscopy

## 2016-03-21 NOTE — Transfer of Care (Signed)
Immediate Anesthesia Transfer of Care Note  Patient: Gregg Moon  Procedure(s) Performed: Procedure(s): COLONOSCOPY WITH PROPOFOL (N/A)  Patient Location: PACU  Anesthesia Type:MAC  Level of Consciousness: sedated  Airway & Oxygen Therapy: Patient Spontanous Breathing and Patient connected to nasal cannula oxygen  Post-op Assessment: Report given to RN and Post -op Vital signs reviewed and stable  Post vital signs: Reviewed and stable  Last Vitals:  Vitals:   03/21/16 1049 03/21/16 1050  BP:  (!) 143/87  Resp: 10   Temp: 36.8 C     Last Pain:  Vitals:   03/21/16 1049  TempSrc: Oral         Complications: No apparent anesthesia complications

## 2016-03-21 NOTE — Anesthesia Postprocedure Evaluation (Signed)
Anesthesia Post Note  Patient: Gregg Moon  Procedure(s) Performed: Procedure(s) (LRB): COLONOSCOPY WITH PROPOFOL (N/A)  Patient location during evaluation: PACU Anesthesia Type: MAC Level of consciousness: awake and alert Pain management: pain level controlled Vital Signs Assessment: post-procedure vital signs reviewed and stable Respiratory status: spontaneous breathing, nonlabored ventilation, respiratory function stable and patient connected to nasal cannula oxygen Cardiovascular status: stable and blood pressure returned to baseline Anesthetic complications: no    Last Vitals:  Vitals:   03/21/16 1150 03/21/16 1200  BP: 136/78 (!) 144/74  Pulse: 65   Resp: 17   Temp:      Last Pain:  Vitals:   03/21/16 1150  TempSrc: Oral                 Dreyah Montrose J

## 2016-03-21 NOTE — Op Note (Signed)
[facility] Patient Name: Gregg Moon Procedure Date: 03/21/2016 MRN: [id number] Attending MD: Gregg Moon , MD Date of Birth: 09-28-1950 CSN: [id number] Age: 65 Admit Type: Outpatient Procedure:                Colonoscopy Indications:              Screening for colorectal malignant neoplasm Providers:                Gregg Fair, MD, Laverta Baltimore RN, RN,                            William Dalton, Technician Referring MD:              Medicines:                Propofol per Anesthesia Complications:            No immediate complications. Estimated Blood Loss:     Estimated blood loss: none. Procedure:                Pre-Anesthesia Assessment:                           - Prior to the procedure, a History and Physical                            was performed, and patient medications and                            allergies were reviewed. The patient's tolerance of                            previous anesthesia was also reviewed. The risks                            and benefits of the procedure and the sedation                            options and risks were discussed with the patient.                            All questions were answered, and informed consent                            was obtained. Prior Anticoagulants: The patient has                            taken aspirin, last dose was 1 day prior to                            procedure. ASA Grade Assessment: II - A patient                            with mild systemic disease. After reviewing the  risks and benefits, the patient was deemed in                            satisfactory condition to undergo the procedure.                           After obtaining informed consent, the colonoscope                            was passed under direct vision. Throughout the                            procedure, the patient's blood pressure, pulse, and   oxygen saturations were monitored continuously. The                            [id number]) scope was introduced through                            the anus and advanced to the the cecum, identified                            by appendiceal orifice and ileocecal valve. The                            colonoscopy was performed without difficulty. The                            patient tolerated the procedure well. The quality                            of the bowel preparation was good. The ileocecal                            valve, the appendiceal orifice and the rectum were                            photographed. Scope In: 11:13:09 AM Scope Out: 11:38:12 AM Scope Withdrawal Time: 0 hours 16 minutes 11 seconds  Total Procedure Duration: 0 hours 25 minutes 3 seconds  Findings:      The perianal and digital rectal examinations were normal.      A 7 mm polyp was found in the proximal descending colon. The polyp was       pedunculated. The polyp was removed with a hot snare. Resection and       retrieval were complete.      A 6 mm polyp was found in the rectum. The polyp was sessile. The polyp       was removed with a hot snare. Resection and retrieval were complete.      The exam was otherwise without abnormality. Impression:               - One 7 mm polyp in the proximal descending colon,  removed with a hot snare. Resected and retrieved.                           - One 6 mm polyp in the rectum, removed with a hot                            snare. Resected and retrieved.                           - The examination was otherwise normal. Moderate Sedation:      N/A- Per Anesthesia Care Recommendation:           - Patient has a contact number available for                            emergencies. The signs and symptoms of potential                            delayed complications were discussed with the                            patient. Return to normal  activities tomorrow.                            Written discharge instructions were provided to the                            patient.                           - Repeat colonoscopy date to be determined after                            pending pathology results are reviewed for                            surveillance.                           - Resume previous diet.                           - Continue present medications. Procedure Code(s):        --- Professional ---                           [phone], Colonoscopy, flexible; with removal of                            tumor(s), polyp(s), or other lesion(s) by snare                            technique Diagnosis Code(s):        --- Professional ---  Z12.11, Encounter for screening for malignant                            neoplasm of colon                           D12.4, Benign neoplasm of descending colon                           K62.1, Rectal polyp CPT copyright 2016 [employer]. All rights reserved. The codes documented in this report are preliminary and upon coder review may  be revised to meet current compliance requirements. Gregg Gell, MD Gregg Fair, MD 03/21/2016 11:45:28 AM This report has been signed electronically. Number of Addenda: 0

## 2016-03-21 NOTE — Anesthesia Preprocedure Evaluation (Signed)
Anesthesia Evaluation  Patient identified by MRN, date of birth, ID band Patient awake    Reviewed: Allergy & Precautions, NPO status , Patient's Chart, lab work & pertinent test results  Airway Mallampati: II  TM Distance: >3 FB Neck ROM: Full    Dental no notable dental hx.    Pulmonary former smoker,    Pulmonary exam normal breath sounds clear to auscultation       Cardiovascular hypertension, Normal cardiovascular exam Rhythm:Regular Rate:Normal     Neuro/Psych Anxiety negative neurological ROS     GI/Hepatic negative GI ROS, Neg liver ROS,   Endo/Other  negative endocrine ROSdiabetes  Renal/GU negative Renal ROS  negative genitourinary   Musculoskeletal  (+) Arthritis ,   Abdominal   Peds negative pediatric ROS (+)  Hematology negative hematology ROS (+)   Anesthesia Other Findings   Reproductive/Obstetrics negative OB ROS                             Anesthesia Physical Anesthesia Plan  ASA: II  Anesthesia Plan: MAC   Post-op Pain Management:    Induction: Intravenous  Airway Management Planned: Natural Airway  Additional Equipment:   Intra-op Plan:   Post-operative Plan:   Informed Consent: I have reviewed the patients History and Physical, chart, labs and discussed the procedure including the risks, benefits and alternatives for the proposed anesthesia with the patient or authorized representative who has indicated his/her understanding and acceptance.   Dental advisory given  Plan Discussed with: CRNA  Anesthesia Plan Comments:         Anesthesia Quick Evaluation

## 2016-03-22 ENCOUNTER — Encounter (HOSPITAL_COMMUNITY): Payer: Self-pay | Admitting: Gastroenterology

## 2016-07-04 DIAGNOSIS — H52202 Unspecified astigmatism, left eye: Secondary | ICD-10-CM | POA: Insufficient documentation

## 2016-07-28 ENCOUNTER — Other Ambulatory Visit: Payer: Self-pay | Admitting: Internal Medicine

## 2016-07-28 DIAGNOSIS — R6889 Other general symptoms and signs: Secondary | ICD-10-CM

## 2016-07-28 DIAGNOSIS — I6523 Occlusion and stenosis of bilateral carotid arteries: Secondary | ICD-10-CM

## 2016-08-01 DIAGNOSIS — R55 Syncope and collapse: Secondary | ICD-10-CM | POA: Insufficient documentation

## 2016-08-01 DIAGNOSIS — R6889 Other general symptoms and signs: Secondary | ICD-10-CM | POA: Insufficient documentation

## 2016-08-02 ENCOUNTER — Ambulatory Visit: Payer: 59

## 2016-08-02 ENCOUNTER — Ambulatory Visit (INDEPENDENT_AMBULATORY_CARE_PROVIDER_SITE_OTHER): Payer: 59

## 2016-08-02 DIAGNOSIS — R6889 Other general symptoms and signs: Secondary | ICD-10-CM | POA: Diagnosis not present

## 2016-08-02 DIAGNOSIS — R55 Syncope and collapse: Secondary | ICD-10-CM

## 2016-08-10 ENCOUNTER — Ambulatory Visit
Admission: RE | Admit: 2016-08-10 | Discharge: 2016-08-10 | Disposition: A | Discharge status: Home / Self Care | Payer: 59 | Source: Ambulatory Visit | Attending: Internal Medicine | Admitting: Internal Medicine

## 2016-08-10 DIAGNOSIS — R6889 Other general symptoms and signs: Secondary | ICD-10-CM

## 2016-08-10 DIAGNOSIS — I6523 Occlusion and stenosis of bilateral carotid arteries: Secondary | ICD-10-CM

## 2016-08-15 ENCOUNTER — Other Ambulatory Visit: Payer: Self-pay | Admitting: Surgery

## 2016-08-15 DIAGNOSIS — I6522 Occlusion and stenosis of left carotid artery: Secondary | ICD-10-CM

## 2016-08-16 ENCOUNTER — Ambulatory Visit (INDEPENDENT_AMBULATORY_CARE_PROVIDER_SITE_OTHER): Payer: 59 | Admitting: Surgery

## 2016-08-16 ENCOUNTER — Encounter: Payer: Self-pay | Admitting: Surgery

## 2016-08-16 ENCOUNTER — Ambulatory Visit (HOSPITAL_COMMUNITY)
Admission: RE | Admit: 2016-08-16 | Discharge: 2016-08-16 | Disposition: A | Discharge status: Home / Self Care | Payer: 59 | Source: Ambulatory Visit | Attending: Surgery | Admitting: Surgery

## 2016-08-16 VITALS — BP 139/84 | HR 67 | Temp 97.7°F | Resp 16 | Ht 69.0 in | Wt 176.0 lb

## 2016-08-16 DIAGNOSIS — I6522 Occlusion and stenosis of left carotid artery: Secondary | ICD-10-CM | POA: Insufficient documentation

## 2016-08-16 LAB — VAS US CAROTID
LCCADDIAS: -19 cm/s
LEFT ECA DIAS: -19 cm/s
LEFT VERTEBRAL DIAS: -26 cm/s
LICADDIAS: -24 cm/s
LICAPSYS: -224 cm/s
Left CCA dist sys: -80 cm/s
Left CCA prox dias: 35 cm/s
Left CCA prox sys: 199 cm/s
Left ICA dist sys: -67 cm/s
Left ICA prox dias: -69 cm/s
RIGHT CCA MID DIAS: -25 cm/s
RIGHT ECA DIAS: -20 cm/s
RIGHT VERTEBRAL DIAS: -11 cm/s
Right CCA prox dias: 23 cm/s
Right CCA prox sys: 134 cm/s
Right cca dist sys: -89 cm/s
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
## 2016-08-17 NOTE — Addendum Note (Signed)
Addended by: Lianne Cure A on: 08/17/2016 02:07 PM   Modules accepted: Orders

## 2017-02-19 ENCOUNTER — Encounter: Payer: Self-pay | Admitting: Surgery

## 2017-02-19 ENCOUNTER — Ambulatory Visit (HOSPITAL_COMMUNITY)
Admission: RE | Admit: 2017-02-19 | Discharge: 2017-02-19 | Disposition: A | Discharge status: Home / Self Care | Payer: 59 | Source: Ambulatory Visit | Attending: Surgery | Admitting: Surgery

## 2017-02-19 ENCOUNTER — Ambulatory Visit (INDEPENDENT_AMBULATORY_CARE_PROVIDER_SITE_OTHER): Payer: 59 | Admitting: Surgery

## 2017-02-19 VITALS — BP 134/80 | HR 76 | Temp 99.7°F | Resp 20 | Ht 69.0 in | Wt 177.7 lb

## 2017-02-19 DIAGNOSIS — I6523 Occlusion and stenosis of bilateral carotid arteries: Secondary | ICD-10-CM | POA: Diagnosis not present

## 2017-02-19 DIAGNOSIS — I6522 Occlusion and stenosis of left carotid artery: Secondary | ICD-10-CM | POA: Diagnosis not present

## 2017-02-19 LAB — VAS US CAROTID
LCCADDIAS: -22 cm/s
LCCADSYS: -83 cm/s
LCCAPDIAS: 38 cm/s
LEFT ECA DIAS: -18 cm/s
LEFT VERTEBRAL DIAS: -28 cm/s
LICADDIAS: -26 cm/s
LICADSYS: -66 cm/s
LICAPDIAS: -123 cm/s
LICAPSYS: -318 cm/s
Left CCA prox sys: 236 cm/s
RIGHT CCA MID DIAS: 25 cm/s
RIGHT ECA DIAS: -14 cm/s
RIGHT VERTEBRAL DIAS: -13 cm/s
Right CCA prox dias: 27 cm/s
Right CCA prox sys: 152 cm/s
Right cca dist sys: -90 cm/s
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
## 2017-02-20 ENCOUNTER — Other Ambulatory Visit: Payer: Self-pay | Admitting: *Deleted

## 2017-02-21 ENCOUNTER — Telehealth: Payer: Self-pay

## 2017-02-21 NOTE — Telephone Encounter (Signed)
Error

## 2017-02-22 ENCOUNTER — Ambulatory Visit (INDEPENDENT_AMBULATORY_CARE_PROVIDER_SITE_OTHER): Payer: 59 | Admitting: Interventional Cardiology

## 2017-02-22 ENCOUNTER — Encounter: Payer: Self-pay | Admitting: Cardiology

## 2017-02-22 VITALS — BP 136/60 | HR 73 | Resp 16 | Ht 69.0 in | Wt 178.0 lb

## 2017-02-22 DIAGNOSIS — Z01818 Encounter for other preprocedural examination: Secondary | ICD-10-CM | POA: Diagnosis not present

## 2017-02-22 NOTE — Patient Instructions (Signed)

## 2017-02-22 NOTE — Progress Notes (Addendum)
02/22/2017 Gregg Moon   August 15, 1950  [id number]  Primary Physician Gregg Orn, MD Primary Cardiologist: New (Dr. Irish Moon, [place])   Reason for Visit/CC: New Patient Evaluation. Cardiac Assessment and Surgical Clearance for carotid endarterectomy.   HPI:  Gregg Moon is a 66 y.o. male who is being seen today for the evaluation of preoperative cardiac assessment for surgical clearance at the request of Gregg Mitchell, MD. Pt has carotid artery disease and is needing to undergo left carotid endarterectomy. He is tentatively scheduled for surgery with Dr. Trula Moon on 03/07/17.   He was initially referred to Dr. Trula Moon given visual disturbances and intermittent left sided facial numbness. He  had an ultrasound performed which showed greater than 70% left carotid stenosis. He wore a Holter monitor and had a MRI of the brain which were negative. He had further imaging which showed 80-99 percent left carotid stenosis.  There is no significant stenosis on the right.  The bifurcation was noted to be high.  He has no known cardiac history but has multiple risk factors including PVD, HTN, HLD, borderline DM and family h/o heart disease. His father had a fatal IM at age 46. He is also a former smoker but quit 30 years ago.   He works as a Administrator. He is constantly moving at work, getting in and of the truck and pushing heavy cans. He denies exertional CP or dyspnea. He is also able to ambulate a flight of stairs and walk 4 blocks w/o chest pain or dyspnea.   EKG shows NSR with no ischemic abnormalities. VSS.   Current Meds  Medication Sig  . aspirin EC 81 MG tablet Take 81 mg by mouth daily.  . sildenafil (VIAGRA) 100 MG tablet Take 100 mg by mouth daily as needed for erectile dysfunction.  . simvastatin (ZOCOR) 40 MG tablet Take 40 mg by mouth daily after breakfast.   . valsartan-hydrochlorothiazide (DIOVAN-HCT) 160-12.5 MG tablet Take 1 tablet by mouth daily.   No Known  Allergies Past Medical History:  Diagnosis Date  . Anxiety    was on meds but has been off for 2-3 months  . Arthritis   . Borderline diabetes   . Carotid artery occlusion   . Diabetes mellitus    borderline  . Hyperlipidemia    takes Zocor daily  . Hypertension    takes Benicar daily  . Neck pain    stenosis  . Weakness    numbness and tingling to the left arm   Family History  Problem Relation Age of Onset  . Heart disease Mother   . Diabetes Mother   . Heart disease Father   . Hypertension Sister   . Diabetes Sister    Past Surgical History:  Procedure Laterality Date  . ANTERIOR CERVICAL CORPECTOMY N/A 07/03/2013   Procedure: C/5 Corpectomy w/strut graft + plating;  Surgeon: Gregg Moore, MD;  Location: [facility];  Service: Neurosurgery;  Laterality: N/A;  . COLONOSCOPY WITH PROPOFOL N/A 03/21/2016   Procedure: COLONOSCOPY WITH PROPOFOL;  Surgeon: Gregg Fair, MD;  Location: [facility];  Service: Endoscopy;  Laterality: N/A;  . INGUINAL HERNIA REPAIR     x 2   Social History   Social History  . Marital status: Married    Spouse name: N/A  . Number of children: N/A  . Years of education: N/A   Occupational History  . Not on file.   Social History Main Topics  . Smoking status:  Former Smoker    Types: Cigarettes    Quit date: 03/17/1986  . Smokeless tobacco: Never Used     Comment: quit smoking 38yrs ago  . Alcohol use Yes     Comment: weekends  . Drug use: No  . Sexual activity: Yes   Other Topics Concern  . Not on file   Social History Narrative  . No narrative on file     Review of Systems: General: negative for chills, fever, night sweats or weight changes.  Cardiovascular: negative for chest pain, dyspnea on exertion, edema, orthopnea, palpitations, paroxysmal nocturnal dyspnea or shortness of breath Dermatological: negative for rash Respiratory: negative for cough or wheezing Urologic: negative for hematuria Abdominal: negative  for nausea, vomiting, diarrhea, bright red blood per rectum, melena, or hematemesis Neurologic: negative for visual changes, syncope, or dizziness All other systems reviewed and are otherwise negative except as noted above.   Physical Exam:  Blood pressure 136/60, pulse 73, resp. rate 16, height 5\' 9"  (1.753 m), weight 178 lb (80.7 kg), SpO2 98 %.  General appearance: alert, cooperative and no distress Neck: no JVD and + left sided bruit. right neck negative.  Lungs: clear to auscultation bilaterally Heart: regular rate and rhythm, S1, S2 normal, no murmur, click, rub or gallop Extremities: extremities normal, atraumatic, no cyanosis or edema Pulses: 2+ and symmetric Skin: Skin color, texture, turgor normal. No rashes or lesions Neurologic: Grossly normal  EKG NSR 67 bpm, no ischemic abnormalities-- personally reviewed   ASSESSMENT AND PLAN:   1. High grade Left Carotid Artery Disease: tentatively scheduled for left CEA per Dr. Trula Moon. He is on ASA and statin therapy. BP well controlled.  2. Pre-operative Risk Assessment: pt with no prior cardiac history. EKG shows NSR w/o ischemic abnormalities. He is able to complete 4 mets of physical activity w/o exertional CP or dyspnea. Physical exam negative for cardiac murmurs.  I've discussed with Dr. Irish Moon, [facility]. Given is Moon of anginal symptoms and normal EKG, there is no need for further cardiac testing prior to surgery. He is of acceptable risk and can be cleared. We will notify Dr. Trula Moon.    Follow-Up w/ Dr. Irish Moon PRN.   Gregg Moon Gregg Moon, [facility] 02/22/2017 3:44 PM   I have examined the patient and reviewed assessment and plan and discussed with patient.  Agree with above as stated.  Given vigorous activity and Moon of sx, no further cardiac testing required before surgery.  Explained the potential cardiac risks of surgery and factors that cannot be modified, and he is in agreement.  Gregg Moon

## 2017-03-01 ENCOUNTER — Encounter (HOSPITAL_COMMUNITY): Payer: Self-pay

## 2017-03-01 ENCOUNTER — Encounter (HOSPITAL_COMMUNITY)
Admission: RE | Admit: 2017-03-01 | Discharge: 2017-03-01 | Disposition: A | Discharge status: Home / Self Care | Payer: 59 | Source: Ambulatory Visit | Attending: Surgery | Admitting: Surgery

## 2017-03-01 DIAGNOSIS — M542 Cervicalgia: Secondary | ICD-10-CM | POA: Insufficient documentation

## 2017-03-01 DIAGNOSIS — Z8249 Family history of ischemic heart disease and other diseases of the circulatory system: Secondary | ICD-10-CM | POA: Insufficient documentation

## 2017-03-01 DIAGNOSIS — Z833 Family history of diabetes mellitus: Secondary | ICD-10-CM | POA: Insufficient documentation

## 2017-03-01 DIAGNOSIS — Z888 Allergy status to other drugs, medicaments and biological substances status: Secondary | ICD-10-CM | POA: Insufficient documentation

## 2017-03-01 DIAGNOSIS — I10 Essential (primary) hypertension: Secondary | ICD-10-CM | POA: Diagnosis not present

## 2017-03-01 DIAGNOSIS — M1990 Unspecified osteoarthritis, unspecified site: Secondary | ICD-10-CM | POA: Insufficient documentation

## 2017-03-01 DIAGNOSIS — Z01812 Encounter for preprocedural laboratory examination: Secondary | ICD-10-CM | POA: Diagnosis not present

## 2017-03-01 DIAGNOSIS — F419 Anxiety disorder, unspecified: Secondary | ICD-10-CM | POA: Insufficient documentation

## 2017-03-01 DIAGNOSIS — I6522 Occlusion and stenosis of left carotid artery: Secondary | ICD-10-CM | POA: Insufficient documentation

## 2017-03-01 DIAGNOSIS — Z7982 Long term (current) use of aspirin: Secondary | ICD-10-CM | POA: Insufficient documentation

## 2017-03-01 DIAGNOSIS — Z87891 Personal history of nicotine dependence: Secondary | ICD-10-CM | POA: Insufficient documentation

## 2017-03-01 DIAGNOSIS — Z79899 Other long term (current) drug therapy: Secondary | ICD-10-CM | POA: Insufficient documentation

## 2017-03-01 LAB — CBC
HCT: 37.9 % — ABNORMAL LOW (ref 39.0–52.0)
Hemoglobin: 12.7 g/dL — ABNORMAL LOW (ref 13.0–17.0)
MCH: 28.9 pg (ref 26.0–34.0)
MCHC: 33.5 g/dL (ref 30.0–36.0)
MCV: 86.1 fL (ref 78.0–100.0)
PLATELETS: 208 10*3/uL (ref 150–400)
RBC: 4.4 MIL/uL (ref 4.22–5.81)
RDW: 12.8 % (ref 11.5–15.5)
WBC: 4.6 10*3/uL (ref 4.0–10.5)

## 2017-03-01 LAB — URINALYSIS, ROUTINE W REFLEX MICROSCOPIC
Bilirubin Urine: NEGATIVE
GLUCOSE, UA: NEGATIVE mg/dL
Hgb urine dipstick: NEGATIVE
KETONES UR: NEGATIVE mg/dL
LEUKOCYTES UA: NEGATIVE
Nitrite: NEGATIVE
PH: 5 (ref 5.0–8.0)
PROTEIN: NEGATIVE mg/dL
SPECIFIC GRAVITY, URINE: 1.016 (ref 1.005–1.030)

## 2017-03-01 LAB — COMPREHENSIVE METABOLIC PANEL
ALBUMIN: 4 g/dL (ref 3.5–5.0)
ALT: 22 U/L (ref 17–63)
AST: 26 U/L (ref 15–41)
Alkaline Phosphatase: 62 U/L (ref 38–126)
Anion gap: 8 (ref 5–15)
BUN: 13 mg/dL (ref 6–20)
CHLORIDE: 106 mmol/L (ref 101–111)
CO2: 23 mmol/L (ref 22–32)
Calcium: 9.2 mg/dL (ref 8.9–10.3)
Creatinine, Ser: 0.8 mg/dL (ref 0.61–1.24)
GFR calc Af Amer: >60 mL/min (ref >60)
Glucose, Bld: 162 mg/dL — ABNORMAL HIGH (ref 65–99)
POTASSIUM: 3.7 mmol/L (ref 3.5–5.1)
Sodium: 137 mmol/L (ref 135–145)
Total Bilirubin: 0.6 mg/dL (ref 0.3–1.2)
Total Protein: 6.2 g/dL — ABNORMAL LOW (ref 6.5–8.1)

## 2017-03-01 LAB — PROTIME-INR
INR: 0.92
PROTHROMBIN TIME: 12.3 s (ref 11.4–15.2)

## 2017-03-01 LAB — SURGICAL PCR SCREEN
MRSA, PCR: NEGATIVE
Staphylococcus aureus: NEGATIVE

## 2017-03-01 LAB — APTT: APTT: 30 s (ref 24–36)

## 2017-03-01 NOTE — Progress Notes (Signed)
PATIENT STATED DR. BRABHAM'S OFFICE INSTRUCTED HIM TO STOP ASPIRIN DAY BEFORE SURGERY.

## 2017-03-01 NOTE — Pre-Procedure Instructions (Signed)
EDIS HUISH  03/01/2017      [id number] [place], [place] - [address] [place] [address] Phone: [phone] Fax: [phone]    Your procedure is scheduled on  Wednesday  03/07/17  Report to [facility] Admitting at 630 A.M.  Call this number if you have problems the morning of surgery:  [phone]   Remember:  Do not eat food or drink liquids after midnight.  Take these medicines the morning of surgery with A SIP OF WATER - NONE   Do not wear jewelry, make-up or nail polish.  Do not wear lotions, powders, or perfumes, or deoderant.  Do not shave 48 hours prior to surgery.  Men may shave face and neck.  Do not bring valuables to the hospital.  [facility] is not responsible for any belongings or valuables.  Contacts, dentures or bridgework may not be worn into surgery.  Leave your suitcase in the car.  After surgery it may be brought to your room.  For patients admitted to the hospital, discharge time will be determined by your treatment team.  Patients discharged the day of surgery will not be allowed to drive home.   Name and phone number of your driver:    Special instructions:  [facility] - Preparing for Surgery  Before surgery, you can play an important role.  Because skin is not sterile, your skin needs to be as free of germs as possible.  You can reduce the number of germs on you skin by washing with CHG (chlorahexidine gluconate) soap before surgery.  CHG is an antiseptic cleaner which kills germs and bonds with the skin to continue killing germs even after washing.  Please DO NOT use if you have an allergy to CHG or antibacterial soaps.  If your skin becomes reddened/irritated stop using the CHG and inform your nurse when you arrive at Short Stay.  Do not shave (including legs and underarms) for at least 48 hours prior to the first CHG shower.  You may shave your face.  Please  follow these instructions carefully:   1.  Shower with CHG Soap the night before surgery and the                                morning of Surgery.  2.  If you choose to wash your hair, wash your hair first as usual with your       normal shampoo.  3.  After you shampoo, rinse your hair and body thoroughly to remove the                      Shampoo.  4.  Use CHG as you would any other liquid soap.  You can apply chg directly       to the skin and wash gently with scrungie or a clean washcloth.  5.  Apply the CHG Soap to your body ONLY FROM THE NECK DOWN.        Do not use on open wounds or open sores.  Avoid contact with your eyes,       ears, mouth and genitals (private parts).  Wash genitals (private parts)       with your normal soap.  6.  Wash thoroughly, paying special attention to the area where your surgery  will be performed.  7.  Thoroughly rinse your body with warm water from the neck down.  8.  DO NOT shower/wash with your normal soap after using and rinsing off       the CHG Soap.  9.  Pat yourself dry with a clean towel.            10.  Wear clean pajamas.            11.  Place clean sheets on your bed the night of your first shower and do not        sleep with pets.  Day of Surgery  Do not apply any lotions/deoderants the morning of surgery.  Please wear clean clothes to the hospital/surgery center.    Please read over the following fact sheets that you were given. Pain Booklet, MRSA Information and Surgical Site Infection Prevention

## 2017-03-07 ENCOUNTER — Inpatient Hospital Stay (HOSPITAL_COMMUNITY)
Admission: RE | Admit: 2017-03-07 | Discharge: 2017-03-08 | DRG: 039 | Disposition: A | Discharge status: Home / Self Care | Payer: 59 | Source: Ambulatory Visit | Attending: Surgery | Admitting: Surgery

## 2017-03-07 ENCOUNTER — Inpatient Hospital Stay (HOSPITAL_COMMUNITY): Payer: 59 | Admitting: Certified Registered Nurse Anesthetist

## 2017-03-07 ENCOUNTER — Encounter (HOSPITAL_COMMUNITY)
Admission: RE | Disposition: A | Discharge status: Home / Self Care | Payer: Self-pay | Source: Ambulatory Visit | Attending: Surgery

## 2017-03-07 ENCOUNTER — Encounter (HOSPITAL_COMMUNITY): Payer: Self-pay

## 2017-03-07 DIAGNOSIS — M1990 Unspecified osteoarthritis, unspecified site: Secondary | ICD-10-CM | POA: Diagnosis present

## 2017-03-07 DIAGNOSIS — Z833 Family history of diabetes mellitus: Secondary | ICD-10-CM

## 2017-03-07 DIAGNOSIS — Z8249 Family history of ischemic heart disease and other diseases of the circulatory system: Secondary | ICD-10-CM

## 2017-03-07 DIAGNOSIS — Z87891 Personal history of nicotine dependence: Secondary | ICD-10-CM

## 2017-03-07 DIAGNOSIS — Z888 Allergy status to other drugs, medicaments and biological substances status: Secondary | ICD-10-CM | POA: Diagnosis not present

## 2017-03-07 DIAGNOSIS — E119 Type 2 diabetes mellitus without complications: Secondary | ICD-10-CM | POA: Diagnosis present

## 2017-03-07 DIAGNOSIS — I6522 Occlusion and stenosis of left carotid artery: Secondary | ICD-10-CM | POA: Diagnosis present

## 2017-03-07 DIAGNOSIS — Z7982 Long term (current) use of aspirin: Secondary | ICD-10-CM

## 2017-03-07 DIAGNOSIS — E7800 Pure hypercholesterolemia, unspecified: Secondary | ICD-10-CM | POA: Diagnosis present

## 2017-03-07 DIAGNOSIS — I739 Peripheral vascular disease, unspecified: Secondary | ICD-10-CM | POA: Diagnosis present

## 2017-03-07 DIAGNOSIS — I10 Essential (primary) hypertension: Secondary | ICD-10-CM | POA: Diagnosis present

## 2017-03-07 DIAGNOSIS — Z79899 Other long term (current) drug therapy: Secondary | ICD-10-CM

## 2017-03-07 HISTORY — DX: Occlusion and stenosis of left carotid artery: I65.22

## 2017-03-07 HISTORY — PX: ENDARTERECTOMY: SHX5162

## 2017-03-07 HISTORY — PX: PATCH ANGIOPLASTY: SHX6230

## 2017-03-07 HISTORY — PX: CAROTID ENDARTERECTOMY: SUR193

## 2017-03-07 LAB — GLUCOSE, CAPILLARY: Glucose-Capillary: 140 mg/dL — ABNORMAL HIGH (ref 65–99)

## 2017-03-07 LAB — TYPE AND SCREEN
ABO/RH(D): O POS
Antibody Screen: POSITIVE
DAT, IgG: NEGATIVE

## 2017-03-07 LAB — POCT ACTIVATED CLOTTING TIME: Activated Clotting Time: 246 seconds

## 2017-03-07 SURGERY — ENDARTERECTOMY, CAROTID
Anesthesia: General | Laterality: Left

## 2017-03-07 MED ORDER — CHLORHEXIDINE GLUCONATE CLOTH 2 % EX PADS: 6.0000 | MEDICATED_PAD | Freq: Once | CUTANEOUS | Status: DC

## 2017-03-07 MED ORDER — FENTANYL CITRATE (PF) 250 MCG/5ML IJ SOLN
INTRAMUSCULAR | Status: DC | PRN
  Administered 2017-03-07 (×2): 50 ug via INTRAVENOUS
  Administered 2017-03-07: 100 ug via INTRAVENOUS

## 2017-03-07 MED ORDER — IRBESARTAN 150 MG PO TABS
150.0000 mg | ORAL_TABLET | Freq: Every day | ORAL | Status: DC
  Administered 2017-03-07 – 2017-03-08 (×2): 150 mg via ORAL
  Filled 2017-03-07 (×2): qty 1

## 2017-03-07 MED ORDER — OXYCODONE HCL 5 MG PO TABS: 5.0000 mg | ORAL_TABLET | Freq: Once | ORAL | Status: DC | PRN

## 2017-03-07 MED ORDER — MORPHINE SULFATE (PF) 2 MG/ML IV SOLN: 2.0000 mg | INTRAVENOUS | Status: DC | PRN

## 2017-03-07 MED ORDER — ROCURONIUM BROMIDE 10 MG/ML (PF) SYRINGE
PREFILLED_SYRINGE | INTRAVENOUS | Status: DC | PRN
Start: 2017-03-07 — End: 2017-03-07
  Administered 2017-03-07: 50 mg via INTRAVENOUS
  Administered 2017-03-07: 30 mg via INTRAVENOUS
  Administered 2017-03-07: 20 mg via INTRAVENOUS

## 2017-03-07 MED ORDER — LIDOCAINE 2% (20 MG/ML) 5 ML SYRINGE
INTRAMUSCULAR | Status: DC | PRN
Start: 2017-03-07 — End: 2017-03-07
  Administered 2017-03-07: 80 mg via INTRAVENOUS

## 2017-03-07 MED ORDER — GLYCOPYRROLATE 0.2 MG/ML IJ SOLN
INTRAMUSCULAR | Status: DC | PRN
  Administered 2017-03-07: .2 mg via INTRAVENOUS

## 2017-03-07 MED ORDER — FENTANYL CITRATE (PF) 100 MCG/2ML IJ SOLN
INTRAMUSCULAR | Status: AC
  Filled 2017-03-07: qty 2

## 2017-03-07 MED ORDER — HYDRALAZINE HCL 20 MG/ML IJ SOLN
5.0000 mg | INTRAMUSCULAR | Status: DC | PRN
  Administered 2017-03-07: 5 mg via INTRAVENOUS
  Filled 2017-03-07: qty 1

## 2017-03-07 MED ORDER — LACTATED RINGERS IV SOLN
INTRAVENOUS | Status: DC | PRN
  Administered 2017-03-07: 08:00:00 via INTRAVENOUS

## 2017-03-07 MED ORDER — HEPARIN SODIUM (PORCINE) 1000 UNIT/ML IJ SOLN
INTRAMUSCULAR | Status: DC | PRN
  Administered 2017-03-07: 8000 [IU] via INTRAVENOUS

## 2017-03-07 MED ORDER — ONDANSETRON HCL 4 MG/2ML IJ SOLN: 4.0000 mg | Freq: Four times a day (QID) | INTRAMUSCULAR | Status: DC | PRN

## 2017-03-07 MED ORDER — DEXTROSE 5 % IV SOLN
INTRAVENOUS | Status: DC | PRN
  Administered 2017-03-07: 25 ug/min via INTRAVENOUS

## 2017-03-07 MED ORDER — CEFUROXIME SODIUM 1.5 G IV SOLR
1.5000 g | Freq: Two times a day (BID) | INTRAVENOUS | Status: AC
  Administered 2017-03-07 – 2017-03-08 (×2): 1.5 g via INTRAVENOUS
  Filled 2017-03-07 (×2): qty 1.5

## 2017-03-07 MED ORDER — SUGAMMADEX SODIUM 200 MG/2ML IV SOLN
INTRAVENOUS | Status: DC | PRN
  Administered 2017-03-07: 180 mg via INTRAVENOUS

## 2017-03-07 MED ORDER — MIDAZOLAM HCL 2 MG/2ML IJ SOLN
INTRAMUSCULAR | Status: AC
  Filled 2017-03-07: qty 2

## 2017-03-07 MED ORDER — ALUM & MAG HYDROXIDE-SIMETH 200-200-20 MG/5ML PO SUSP: 15.0000 mL | ORAL | Status: DC | PRN

## 2017-03-07 MED ORDER — LABETALOL HCL 5 MG/ML IV SOLN
10.0000 mg | INTRAVENOUS | Status: DC | PRN
  Administered 2017-03-07 (×2): 10 mg via INTRAVENOUS

## 2017-03-07 MED ORDER — ASPIRIN EC 81 MG PO TBEC
81.0000 mg | DELAYED_RELEASE_TABLET | Freq: Every day | ORAL | Status: DC
  Administered 2017-03-08: 81 mg via ORAL
  Filled 2017-03-07: qty 1

## 2017-03-07 MED ORDER — SODIUM CHLORIDE 0.9 % IV SOLN
INTRAVENOUS | Status: DC
  Administered 2017-03-07: 15:00:00 via INTRAVENOUS

## 2017-03-07 MED ORDER — SUGAMMADEX SODIUM 200 MG/2ML IV SOLN: INTRAVENOUS | Status: DC | PRN

## 2017-03-07 MED ORDER — SODIUM CHLORIDE 0.9 % IV SOLN
0.0125 ug/kg/min | INTRAVENOUS | Status: AC
  Administered 2017-03-07: .1 ug/kg/min via INTRAVENOUS
  Filled 2017-03-07: qty 2000

## 2017-03-07 MED ORDER — DOCUSATE SODIUM 100 MG PO CAPS
100.0000 mg | ORAL_CAPSULE | Freq: Every day | ORAL | Status: DC
  Administered 2017-03-08: 100 mg via ORAL
  Filled 2017-03-07: qty 1

## 2017-03-07 MED ORDER — ACETAMINOPHEN 325 MG PO TABS
325.0000 mg | ORAL_TABLET | ORAL | Status: DC | PRN
  Administered 2017-03-08: 650 mg via ORAL
  Filled 2017-03-07: qty 2

## 2017-03-07 MED ORDER — HEMOSTATIC AGENTS (NO CHARGE) OPTIME
TOPICAL | Status: DC | PRN
  Administered 2017-03-07: 1 via TOPICAL

## 2017-03-07 MED ORDER — OXYCODONE HCL 5 MG/5ML PO SOLN: 5.0000 mg | Freq: Once | ORAL | Status: DC | PRN

## 2017-03-07 MED ORDER — PROPOFOL 10 MG/ML IV BOLUS
INTRAVENOUS | Status: DC | PRN
  Administered 2017-03-07: 200 mg via INTRAVENOUS

## 2017-03-07 MED ORDER — SODIUM CHLORIDE 0.9 % IV SOLN: 500.0000 mL | Freq: Once | INTRAVENOUS | Status: DC | PRN

## 2017-03-07 MED ORDER — DEXAMETHASONE SODIUM PHOSPHATE 10 MG/ML IJ SOLN
INTRAMUSCULAR | Status: DC | PRN
  Administered 2017-03-07: 5 mg via INTRAVENOUS

## 2017-03-07 MED ORDER — POTASSIUM CHLORIDE CRYS ER 20 MEQ PO TBCR: 20.0000 meq | EXTENDED_RELEASE_TABLET | Freq: Every day | ORAL | Status: DC | PRN

## 2017-03-07 MED ORDER — MAGNESIUM SULFATE 2 GM/50ML IV SOLN
2.0000 g | Freq: Every day | INTRAVENOUS | Status: DC | PRN
  Filled 2017-03-07: qty 50

## 2017-03-07 MED ORDER — PROPOFOL 10 MG/ML IV BOLUS
INTRAVENOUS | Status: AC
  Filled 2017-03-07: qty 20

## 2017-03-07 MED ORDER — SODIUM CHLORIDE 0.9 % IV SOLN
INTRAVENOUS | Status: DC | PRN
  Administered 2017-03-07: 09:00:00

## 2017-03-07 MED ORDER — OXYCODONE-ACETAMINOPHEN 5-325 MG PO TABS
1.0000 | ORAL_TABLET | ORAL | Status: DC | PRN
  Administered 2017-03-07: 2 via ORAL
  Filled 2017-03-07: qty 2

## 2017-03-07 MED ORDER — SODIUM CHLORIDE 0.9 % IV SOLN: INTRAVENOUS | Status: DC

## 2017-03-07 MED ORDER — LACTATED RINGERS IV SOLN
INTRAVENOUS | Status: DC | PRN
  Administered 2017-03-07 (×2): via INTRAVENOUS

## 2017-03-07 MED ORDER — PHENOL 1.4 % MT LIQD: 1.0000 | OROMUCOSAL | Status: DC | PRN

## 2017-03-07 MED ORDER — PROTAMINE SULFATE 10 MG/ML IV SOLN
INTRAVENOUS | Status: AC
  Filled 2017-03-07: qty 25

## 2017-03-07 MED ORDER — DEXTROSE 5 % IV SOLN
1.5000 g | INTRAVENOUS | Status: AC
  Administered 2017-03-07: 1.5 g via INTRAVENOUS
  Filled 2017-03-07: qty 1.5

## 2017-03-07 MED ORDER — METOPROLOL TARTRATE 5 MG/5ML IV SOLN: 2.0000 mg | INTRAVENOUS | Status: DC | PRN

## 2017-03-07 MED ORDER — LABETALOL HCL 5 MG/ML IV SOLN
INTRAVENOUS | Status: DC | PRN
  Administered 2017-03-07: 5 mg via INTRAVENOUS

## 2017-03-07 MED ORDER — ACETAMINOPHEN 650 MG RE SUPP: 325.0000 mg | RECTAL | Status: DC | PRN

## 2017-03-07 MED ORDER — HYDROCHLOROTHIAZIDE 12.5 MG PO CAPS
12.5000 mg | ORAL_CAPSULE | Freq: Every day | ORAL | Status: DC
  Administered 2017-03-07 – 2017-03-08 (×2): 12.5 mg via ORAL
  Filled 2017-03-07 (×2): qty 1

## 2017-03-07 MED ORDER — FENTANYL CITRATE (PF) 100 MCG/2ML IJ SOLN
25.0000 ug | INTRAMUSCULAR | Status: DC | PRN
  Administered 2017-03-07 (×3): 25 ug via INTRAVENOUS

## 2017-03-07 MED ORDER — GUAIFENESIN-DM 100-10 MG/5ML PO SYRP: 15.0000 mL | ORAL_SOLUTION | ORAL | Status: DC | PRN

## 2017-03-07 MED ORDER — FENTANYL CITRATE (PF) 250 MCG/5ML IJ SOLN
INTRAMUSCULAR | Status: AC
  Filled 2017-03-07: qty 5

## 2017-03-07 MED ORDER — VALSARTAN-HYDROCHLOROTHIAZIDE 160-12.5 MG PO TABS: 1.0000 | ORAL_TABLET | Freq: Every day | ORAL | Status: DC

## 2017-03-07 MED ORDER — EPHEDRINE SULFATE 50 MG/ML IJ SOLN
INTRAMUSCULAR | Status: DC | PRN
  Administered 2017-03-07: 10 mg via INTRAVENOUS
  Administered 2017-03-07: 5 mg via INTRAVENOUS

## 2017-03-07 MED ORDER — LIDOCAINE HCL 1 % IJ SOLN
INTRAMUSCULAR | Status: AC
  Filled 2017-03-07: qty 20

## 2017-03-07 MED ORDER — SIMVASTATIN 40 MG PO TABS
40.0000 mg | ORAL_TABLET | Freq: Every day | ORAL | Status: DC
  Administered 2017-03-08: 40 mg via ORAL
  Filled 2017-03-07: qty 1

## 2017-03-07 MED ORDER — PROTAMINE SULFATE 10 MG/ML IV SOLN
INTRAVENOUS | Status: DC | PRN
Start: 2017-03-07 — End: 2017-03-07
  Administered 2017-03-07: 50 mg via INTRAVENOUS

## 2017-03-07 MED ORDER — PANTOPRAZOLE SODIUM 40 MG PO TBEC
40.0000 mg | DELAYED_RELEASE_TABLET | Freq: Every day | ORAL | Status: DC
  Administered 2017-03-07 – 2017-03-08 (×2): 40 mg via ORAL
  Filled 2017-03-07 (×2): qty 1

## 2017-03-07 MED ORDER — LABETALOL HCL 5 MG/ML IV SOLN
INTRAVENOUS | Status: AC
  Filled 2017-03-07: qty 4

## 2017-03-07 MED ORDER — PHENYLEPHRINE HCL 10 MG/ML IJ SOLN
INTRAMUSCULAR | Status: DC | PRN
  Administered 2017-03-07: 80 ug via INTRAVENOUS

## 2017-03-07 MED ORDER — 0.9 % SODIUM CHLORIDE (POUR BTL) OPTIME
TOPICAL | Status: DC | PRN
  Administered 2017-03-07: 1000 mL

## 2017-03-07 SURGICAL SUPPLY — 51 items
CANISTER SUCT 3000ML PPV (MISCELLANEOUS) ×3 IMPLANT
CATH ROBINSON RED A/P 18FR (CATHETERS) ×3 IMPLANT
CATH SUCT 10FR WHISTLE TIP (CATHETERS) IMPLANT
CLIP VESOCCLUDE MED 6/CT (CLIP) ×3 IMPLANT
CLIP VESOCCLUDE SM WIDE 6/CT (CLIP) ×3 IMPLANT
COVER PROBE W GEL 5X96 (DRAPES) ×3 IMPLANT
CRADLE DONUT ADULT HEAD (MISCELLANEOUS) ×3 IMPLANT
DERMABOND ADVANCED (GAUZE/BANDAGES/DRESSINGS) ×2
DERMABOND ADVANCED .7 DNX12 (GAUZE/BANDAGES/DRESSINGS) ×1 IMPLANT
DRAIN CHANNEL 15F RND FF W/TCR (WOUND CARE) IMPLANT
ELECT REM PT RETURN 9FT ADLT (ELECTROSURGICAL) ×3
ELECTRODE REM PT RTRN 9FT ADLT (ELECTROSURGICAL) ×1 IMPLANT
EVACUATOR SILICONE 100CC (DRAIN) IMPLANT
GLOVE BIOGEL PI IND STRL 6.5 (GLOVE) ×2 IMPLANT
GLOVE BIOGEL PI IND STRL 7.5 (GLOVE) ×1 IMPLANT
GLOVE BIOGEL PI IND STRL 8.5 (GLOVE) ×1 IMPLANT
GLOVE BIOGEL PI INDICATOR 6.5 (GLOVE) ×4
GLOVE BIOGEL PI INDICATOR 7.5 (GLOVE) ×2
GLOVE BIOGEL PI INDICATOR 8.5 (GLOVE) ×2
GLOVE ECLIPSE 6.5 STRL STRAW (GLOVE) ×3 IMPLANT
GLOVE SURG SS PI 6.5 STRL IVOR (GLOVE) ×3 IMPLANT
GLOVE SURG SS PI 7.5 STRL IVOR (GLOVE) ×3 IMPLANT
GOWN STRL REUS W/ TWL LRG LVL3 (GOWN DISPOSABLE) ×2 IMPLANT
GOWN STRL REUS W/ TWL XL LVL3 (GOWN DISPOSABLE) ×2 IMPLANT
GOWN STRL REUS W/TWL LRG LVL3 (GOWN DISPOSABLE) ×4
GOWN STRL REUS W/TWL XL LVL3 (GOWN DISPOSABLE) ×4
HEMOSTAT SNOW SURGICEL 2X4 (HEMOSTASIS) ×3 IMPLANT
INSERT FOGARTY SM (MISCELLANEOUS) IMPLANT
KIT BASIN OR (CUSTOM PROCEDURE TRAY) ×3 IMPLANT
KIT ROOM TURNOVER OR (KITS) ×3 IMPLANT
KIT SHUNT ARGYLE CAROTID ART 6 (VASCULAR PRODUCTS) IMPLANT
NEEDLE HYPO 25GX1X1/2 BEV (NEEDLE) IMPLANT
NS IRRIG 1000ML POUR BTL (IV SOLUTION) ×9 IMPLANT
PACK CAROTID (CUSTOM PROCEDURE TRAY) ×3 IMPLANT
PAD ARMBOARD 7.5X6 YLW CONV (MISCELLANEOUS) ×6 IMPLANT
PATCH VASC XENOSURE 1CMX6CM (Vascular Products) ×2 IMPLANT
PATCH VASC XENOSURE 1X6 (Vascular Products) ×1 IMPLANT
SHUNT CAROTID BYPASS 10 (VASCULAR PRODUCTS) IMPLANT
SHUNT CAROTID BYPASS 12FRX15.5 (VASCULAR PRODUCTS) IMPLANT
SPONGE LAP 18X18 X RAY DECT (DISPOSABLE) ×3 IMPLANT
SUT ETHILON 3 0 PS 1 (SUTURE) IMPLANT
SUT PROLENE 6 0 BV (SUTURE) ×12 IMPLANT
SUT PROLENE 7 0 BV 1 (SUTURE) IMPLANT
SUT PROLENE 7 0 BV1 MDA (SUTURE) ×3 IMPLANT
SUT SILK 3 0 (SUTURE)
SUT SILK 3-0 18XBRD TIE 12 (SUTURE) IMPLANT
SUT VIC AB 3-0 SH 27 (SUTURE) ×4
SUT VIC AB 3-0 SH 27X BRD (SUTURE) ×2 IMPLANT
SUT VICRYL 4-0 PS2 18IN ABS (SUTURE) ×3 IMPLANT
SYR CONTROL 10ML LL (SYRINGE) IMPLANT
WATER STERILE IRR 1000ML POUR (IV SOLUTION) ×3 IMPLANT

## 2017-03-07 NOTE — Interval H&P Note (Signed)
History and Physical Interval Note:  03/07/2017 6:57 AM  Gregg Moon  has presented today for surgery, with the diagnosis of left carotid stenosis  The various methods of treatment have been discussed with the patient and family. After consideration of risks, benefits and other options for treatment, the patient has consented to  Procedure(s): ENDARTERECTOMY CAROTID (Left) as a surgical intervention .  The patient's history has been reviewed, patient examined, no change in status, stable for surgery.  I have reviewed the patient's chart and labs.  Questions were answered to the patient's satisfaction.     Annamarie Major

## 2017-03-07 NOTE — Anesthesia Procedure Notes (Signed)
Arterial Line Insertion Start/End10/02/2017 8:15 AM, 03/07/2017 8:18 AM Performed by: Bryson Corona, CRNA  Preanesthetic checklist: patient identified, IV checked, site marked, risks and benefits discussed, surgical consent, monitors and equipment checked, pre-op evaluation, timeout performed and anesthesia consent Lidocaine 1% used for infiltration Right, radial was placed Catheter size: 20 G Hand hygiene performed , maximum sterile barriers used  and Seldinger technique used  Attempts: 1 Procedure performed without using ultrasound guided technique. Following insertion, dressing applied and Biopatch. Post procedure assessment: normal  Patient tolerated the procedure well with no immediate complications.

## 2017-03-07 NOTE — Anesthesia Procedure Notes (Addendum)
Procedure Name: Intubation Date/Time: 03/07/2017 8:48 AM Performed by: Bryson Corona Pre-anesthesia Checklist: Emergency Drugs available, Patient identified, Suction available and Patient being monitored Patient Re-evaluated:Patient Re-evaluated prior to induction Oxygen Delivery Method: Circle system utilized Preoxygenation: Pre-oxygenation with 100% oxygen Induction Type: IV induction Ventilation: Oral airway inserted - appropriate to patient size Laryngoscope Size: Glidescope and 3 Grade View: Grade I Tube type: Oral Tube size: 7.0 mm Number of attempts: 1 Placement Confirmation: ETT inserted through vocal cords under direct vision,  positive ETCO2 and breath sounds checked- equal and bilateral Secured at: 23 cm Tube secured with: Tape Dental Injury: Teeth and Oropharynx as per pre-operative assessment  Difficulty Due To: Difficult Airway- due to anterior larynx, Difficult Airway- due to limited oral opening and Difficult Airway- due to reduced neck mobility Comments: Unable to obtain view with MAC 3 due to limited neck mobility and anterior airway. Grade 1 view with glidescope.

## 2017-03-07 NOTE — Anesthesia Preprocedure Evaluation (Signed)
Anesthesia Evaluation  Patient identified by MRN, date of birth, ID band Patient awake    Reviewed: Allergy & Precautions, H&P , NPO status , Patient's Chart, lab work & pertinent test results  Airway Mallampati: II   Neck ROM: full    Dental   Pulmonary former smoker,    breath sounds clear to auscultation       Cardiovascular hypertension, + Peripheral Vascular Disease   Rhythm:regular Rate:Normal     Neuro/Psych PSYCHIATRIC DISORDERS Anxiety    GI/Hepatic   Endo/Other    Renal/GU      Musculoskeletal  (+) Arthritis ,   Abdominal   Peds  Hematology   Anesthesia Other Findings   Reproductive/Obstetrics                             Anesthesia Physical Anesthesia Plan  ASA: II  Anesthesia Plan: General   Post-op Pain Management:    Induction: Intravenous  PONV Risk Score and Plan: 2 and Ondansetron, Dexamethasone, Midazolam and Treatment may vary due to age or medical condition  Airway Management Planned: Oral ETT  Additional Equipment: Arterial line  Intra-op Plan:   Post-operative Plan: Extubation in OR  Informed Consent: I have reviewed the patients History and Physical, chart, labs and discussed the procedure including the risks, benefits and alternatives for the proposed anesthesia with the patient or authorized representative who has indicated his/her understanding and acceptance.     Plan Discussed with: CRNA, Anesthesiologist and Surgeon  Anesthesia Plan Comments:         Anesthesia Quick Evaluation

## 2017-03-07 NOTE — Transfer of Care (Signed)
Immediate Anesthesia Transfer of Care Note  Patient: Gregg Moon  Procedure(s) Performed: ENDARTERECTOMY CAROTID LEFT (Left ) PATCH ANGIOPLASTY LEFT CAROTID ARTERY (Left )  Patient Location: PACU  Anesthesia Type:General  Level of Consciousness: awake, alert , patient cooperative and responds to stimulation  Airway & Oxygen Therapy: Patient Spontanous Breathing and Patient connected to face mask oxygen  Post-op Assessment: Report given to RN, Post -op Vital signs reviewed and stable, Patient moving all extremities, Patient moving all extremities X 4 and Patient able to stick tongue midline  Post vital signs: Reviewed and stable  Last Vitals:  Vitals:   03/07/17 0647  BP: (!) 152/80  Pulse: 66  Resp: 16  Temp: 36.5 C  SpO2: 98%    Last Pain:  Vitals:   03/07/17 0647  TempSrc: Oral      Patients Stated Pain Goal: 3 (60/15/61 5379)  Complications: No apparent anesthesia complications
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
## 2017-03-07 NOTE — Progress Notes (Signed)
  Day of Surgery Note    Subjective:  Wants to get out of the bed   Vitals:   03/07/17 1250 03/07/17 1251  BP: (!) 167/79 (!) 173/83  Pulse: 85 87  Resp: 15 15  Temp:    SpO2: 99% 99%    Incisions:   Clean and dry without hematoma Extremities:  Moving all extremities equally Cardiac:  regular Lungs:  Non labored Neuro:  Int tact; tongue midline   Assessment/Plan:  This is a 66 y.o. male who is s/p  Left carotid endarterectomy  -pt doing well in recovery and neuro in tact.  -a-line reading [phone] systolic-cuff pressure is ready [phone] systolic bilaterally.  Will remove a-line and go by cuff pressure per Dr. Trula Slade.  Has received one does of Labetalol so far.  -to [place] when bed available.    Leontine Locket, PA-C 03/07/2017 1:34 PM [phone]

## 2017-03-07 NOTE — Discharge Instructions (Signed)
° °  [facility] [place] ° °Discharge Instructions °  °Carotid Endarterectomy (CEA) ° °Please refer to the following instructions for your post-procedure care. Your surgeon or physician assistant will discuss any changes with you. ° °Activity ° °You are encouraged to walk as much as you can. You can slowly return to normal activities but must avoid strenuous activity and heavy lifting until your doctor tell you it's OK. Avoid activities such as vacuuming or swinging a golf club. You can drive after one week if you are comfortable and you are no longer taking prescription pain medications. It is normal to feel tired for serval weeks after your surgery. It is also normal to have difficulty with sleep habits, eating, and bowel movements after surgery. These will go away with time. ° °Bathing/Showering ° °You may shower after you go home. Do not soak in a bathtub, hot tub, or swim until the incision heals completely. ° °Incision Care ° °Shower every day. Clean your incision with mild soap and water. Pat the area dry with a clean towel. You do not need a bandage unless otherwise instructed. Do not apply any ointments or creams to your incision. You may have skin glue on your incision. Do not peel it off. It will come off on its own in about one week. Your incision may feel thickened and raised for several weeks after your surgery. This is normal and the skin will soften over time. For Men Only: It's OK to shave around the incision but do not shave the incision itself for 2 weeks. It is common to have numbness under your chin that could last for several months. ° °Diet ° °Resume your normal diet. There are no special food restrictions following this procedure. A low fat/low cholesterol diet is recommended for all patients with vascular disease. In order to heal from your surgery, it is CRITICAL to get adequate nutrition. Your body requires vitamins, minerals, and protein. Vegetables are the best  source of vitamins and minerals. Vegetables also provide the perfect balance of protein. Processed food has little nutritional value, so try to avoid this. ° °Medications ° °Resume taking all of your medications unless your doctor or physician assistant tells you not to. If your incision is causing pain, you may take over-the- counter pain relievers such as acetaminophen (Tylenol). If you were prescribed a stronger pain medication, please be aware these medications can cause nausea and constipation. Prevent nausea by taking the medication with a snack or meal. Avoid constipation by drinking plenty of fluids and eating foods with a high amount of fiber, such as fruits, vegetables, and grains. Do not take Tylenol if you are taking prescription pain medications. ° °Follow Up ° °Our office will schedule a follow up appointment 2-3 weeks following discharge. ° °Please call us immediately for any of the following conditions ° °Increased pain, redness, drainage (pus) from your incision site. °Fever of 101 degrees or higher. °If you should develop stroke (slurred speech, difficulty swallowing, weakness on one side of your body, loss of vision) you should call 911 and go to the nearest emergency room. ° °Reduce your risk of vascular disease: ° °Stop smoking. If you would like help call [facility] at 1-800-QUIT-NOW (1-800-784-8669) or [place] at 336-586-4000. °Manage your cholesterol °Maintain a desired weight °Control your diabetes °Keep your blood pressure down ° °If you have any questions, please call the office at [phone]. ° °

## 2017-03-07 NOTE — Op Note (Addendum)
Patient name: Gregg Moon MRN: [id number] DOB: 1950/07/15 Sex: male  03/07/2017 Pre-operative Diagnosis: Asymptomatic   left carotid stenosis Post-operative diagnosis:  Same Surgeon:  Annamarie Major Assistants:  Lezlie Octave Procedure:    left carotid Endarterectomy with bovine pericardial patch angioplasty Anesthesia:  General Blood Loss:  See anesthesia record Specimens:  None Findings:  80 %stenosis; Thrombus:  none  Indications:  66 year old male with asymptomatic left carotid stenosis by duplex  Procedure:  The patient was identified in the holding area and taken to [place] [facility]  The patient was then placed supine on the table.   General endotrachial anesthesia was administered.  The patient was prepped and draped in the usual sterile fashion.  A time out was called and antibiotics were administered.  The incision was made along the anterior border of the left sternocleidomastoid muscle.  Cautery was used to dissect through the subcutaneous tissue.  The platysma muscle was divided with cautery.  The internal jugular vein was exposed along its anterior medial border.  The common facial vein was exposed and then divided between 2-0 silk ties and metal clips.  The common carotid artery was then circumferentially exposed and encircled with an umbilical tape.  The vagus nerve was identified and protected.  Next sharp dissection was used to expose the external carotid artery and the superior thyroid artery.  The were encircled with a blue vessel loop and a 2-0 silk tie respectively.  Finally, the internal carotid was carefully dissected free.  An umbilical tape was placed around the internal carotid artery distal to the diseased segment.  The hypoglossal nerve was visualized throughout and protected.  The patient was given systemic heparinization.  A bovine carotid patch was selected and prepared on the back table.  A 10 french shunt was also prepared.  After blood pressure readings were  appropriate and the heparin had been given time to circulate, the internal carotid artery was occluded with a baby Gregory clamp.  The external and common carotid arteries were then occluded with vascular clamps and the 2-0 tie tightened on the superior thyroid artery.  A #11 blade was used to make an arteriotomy in the common carotid artery.  This was extended with Potts scissors along the anterior and lateral border of the common and internal carotid artery.  Approximately 80% stenosis was identified.  There was no thrombus identified.  The 10 french shunt was then placed.  A kleiner kuntz elevator was used to perform endarterectomy.  An eversion endarterectomy was performed in the external carotid artery.  A good distal endpoint was obtained in the internal carotid artery.  The specimen was removed and sent to pathology.  Heparinized saline was used to irrigate the endarterectomized field.  All potential embolic debris was removed.  Bovine pericardial patch angioplasty was then performed using a running 6-0 Prolene. Just prior to completion of the repair, the shunt was removed. The common internal and external carotid arteries were all appropriately flushed. The artery was again irrigated with heparin saline.  The anastomosis was then secured. The clamp was first released on the external carotid artery followed by the common carotid artery approximately 30 seconds later, bloodflow was reestablish through the internal carotid artery.  Next, a hand-held  Doppler was used to evaluate the signals in the common, external, and internal  carotid arteries, all of which had appropriate signals. I then administered  50 mg protamine. The wound was then irrigated.  After hemostasis was  achieved, the carotid sheath was reapproximated with 3-0 Vicryl. The  platysma muscle was reapproximated with running 3-0 Vicryl. The skin  was closed with 4-0 Vicryl. Dermabond was placed on the skin. The  patient was then successfully  extubated. His neurologic exam was  similar to his preprocedural exam. The patient was then taken to recovery room  in stable condition. There were no complications.     Disposition:  To PACU in stable condition.  Relevant Operative Details:  Ulcerated plaque at bifurcation.  High bifurcation.  Theotis Burrow, M.D. [facility] [place] Office: [phone] Pager:  [phone]

## 2017-03-08 ENCOUNTER — Encounter (HOSPITAL_COMMUNITY): Payer: Self-pay | Admitting: Surgery

## 2017-03-08 LAB — CBC
HCT: 35.3 % — ABNORMAL LOW (ref 39.0–52.0)
HEMOGLOBIN: 11.8 g/dL — AB (ref 13.0–17.0)
MCH: 29.1 pg (ref 26.0–34.0)
MCHC: 33.4 g/dL (ref 30.0–36.0)
MCV: 87.2 fL (ref 78.0–100.0)
Platelets: 190 10*3/uL (ref 150–400)
RBC: 4.05 MIL/uL — ABNORMAL LOW (ref 4.22–5.81)
RDW: 13 % (ref 11.5–15.5)
WBC: 7.9 10*3/uL (ref 4.0–10.5)

## 2017-03-08 LAB — BASIC METABOLIC PANEL
Anion gap: 6 (ref 5–15)
BUN: 10 mg/dL (ref 6–20)
CHLORIDE: 103 mmol/L (ref 101–111)
CO2: 27 mmol/L (ref 22–32)
Calcium: 9 mg/dL (ref 8.9–10.3)
Creatinine, Ser: 0.77 mg/dL (ref 0.61–1.24)
GFR calc Af Amer: >60 mL/min (ref >60)
GFR calc non Af Amer: >60 mL/min (ref >60)
GLUCOSE: 150 mg/dL — AB (ref 65–99)
Potassium: 3.6 mmol/L (ref 3.5–5.1)
SODIUM: 136 mmol/L (ref 135–145)

## 2017-03-08 NOTE — Progress Notes (Signed)
  Progress Note    03/08/2017 7:21 AM 1 Day Post-Op  Subjective:  Feels like he's getting a cold.  Hasn't walked in the halls.  Swallowing okay.  Afebrile HR 80's-100's NSR 203'T-597'C systolic 16%3AG5XM Vitals:   03/08/17 0000 03/08/17 0423  BP: (!) 151/75 134/74  Pulse: 96 80  Resp: (!) 24 (!) 27  Temp:  98.9 F (37.2 C)  SpO2: 95% 96%     Physical Exam: Neuro:  In tact; tongue is midline Lungs:  Non labored Incision:  Clean and dry; no hematoma  CBC    Component Value Date/Time   WBC 7.9 03/08/2017 0440   RBC 4.05 (L) 03/08/2017 0440   HGB 11.8 (L) 03/08/2017 0440   HCT 35.3 (L) 03/08/2017 0440   PLT 190 03/08/2017 0440   MCV 87.2 03/08/2017 0440   MCH 29.1 03/08/2017 0440   MCHC 33.4 03/08/2017 0440   RDW 13.0 03/08/2017 0440   LYMPHSABS 2.9 07/01/2013 1618   MONOABS 0.3 07/01/2013 1618   EOSABS 0.1 07/01/2013 1618   BASOSABS 0.0 07/01/2013 1618    BMET    Component Value Date/Time   NA 136 03/08/2017 0440   K 3.6 03/08/2017 0440   CL 103 03/08/2017 0440   CO2 27 03/08/2017 0440   GLUCOSE 150 (H) 03/08/2017 0440   BUN 10 03/08/2017 0440   CREATININE 0.77 03/08/2017 0440   CALCIUM 9.0 03/08/2017 0440   GFRNONAA >60 03/08/2017 0440   GFRAA >60 03/08/2017 0440     Intake/Output Summary (Last 24 hours) at 03/08/17 0721 Last data filed at 03/08/17 0430  Gross per 24 hour  Intake             2120 ml  Output              150 ml  Net             1970 ml     Assessment/Plan:  This is a 66 y.o. male who is s/p left CEA 1 Day Post-Op  -pt is doing well this am. -pt neuro exam is in tact -pt has not ambulated in the halls but has to the restroom -pt has voided -f/u with Dr. Trula Slade in 2 weeks. -pt does not want narcotics to discharge with.     Leontine Locket, PA-C [facility] [phone]

## 2017-03-08 NOTE — Progress Notes (Signed)
Elevated BP of 176/71 this am. Pt asymptomatic. Spoke with Kateri Plummer PA. Gave am aspirin, irbesartan, and hydrochlorothiazide early. Will recheck BP. Pt ambulated in hall 320 ft on room air. Tolerated well, HR max 112, O2 saturations on room air remained above 93%.   Fritz Pickerel, RN

## 2017-03-08 NOTE — Progress Notes (Signed)
BP resolved to 146/73. Kateri Plummer PA notified. Discussed with the patient and all questioned fully answered. He will call me if any problems arise. IV removed. Telemetry removed.   Fritz Pickerel, RN
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
## 2017-03-09 ENCOUNTER — Telehealth: Payer: Self-pay | Admitting: Surgery

## 2017-03-09 LAB — BPAM RBC
BLOOD PRODUCT EXPIRATION DATE: 201810302359
BLOOD PRODUCT EXPIRATION DATE: 201810302359
UNIT TYPE AND RH: 5100
Unit Type and Rh: 5100

## 2017-03-09 LAB — TYPE AND SCREEN
ABO/RH(D): O POS
ANTIBODY SCREEN: POSITIVE
DAT, IgG: NEGATIVE
Unit division: 0
Unit division: 0

## 2017-03-09 NOTE — Telephone Encounter (Signed)
Sched appt 04/02/17 at 8:45. Lm on cell#.

## 2017-03-09 NOTE — Telephone Encounter (Signed)
-----   Message from Mena Goes, RN sent at 03/07/2017  3:49 PM EDT ----- Regarding: 2-3 weeks post CEA   ----- Message ----- From: Gabriel Earing, PA-C Sent: 03/07/2017  11:28 AM To: [facility] Charge Pool  S/p left CEA.  F/u with Dr. Trula Slade in 2-3 weeks.  Thanks

## 2017-03-09 NOTE — Care Management Note (Signed)
Case Management Note Marvetta Gibbons RN, BSN Unit 4E-Case Manager [phone]  Patient Details  Name: Gregg Moon MRN: [id number] Date of Birth: 30-Apr-1951  Subjective/Objective:    Pt admitted s/p CEA         Action/Plan: PTA pt lived at [place] to d/c home today- no CM needs noted for discharge  Expected Discharge Date:  03/08/17               Expected Discharge Plan:  Home/Self Care  In-House Referral:  NA  Discharge planning Services  CM Consult  Post Acute Care Choice:  NA Choice offered to:  NA  DME Arranged:    DME Agency:     HH Arranged:    [place] Agency:     Status of Service:  Completed, signed off  If discussed at [employer] of Stay Meetings, dates discussed:    Additional Comments:  Dawayne Patricia, RN 03/09/2017, 12:00 PM

## 2017-03-09 NOTE — Anesthesia Postprocedure Evaluation (Signed)
Anesthesia Post Note  Patient: Gregg Moon  Procedure(s) Performed: ENDARTERECTOMY CAROTID LEFT (Left ) PATCH ANGIOPLASTY LEFT CAROTID ARTERY (Left )     Patient location during evaluation: PACU Anesthesia Type: General Level of consciousness: awake and alert Pain management: pain level controlled Vital Signs Assessment: post-procedure vital signs reviewed and stable Respiratory status: spontaneous breathing, nonlabored ventilation, respiratory function stable and patient connected to nasal cannula oxygen Cardiovascular status: blood pressure returned to baseline and stable Postop Assessment: no apparent nausea or vomiting Anesthetic complications: no    Last Vitals:  Vitals:   03/08/17 0842 03/08/17 1011  BP: (!) 164/85 (!) 146/73  Pulse:  87  Resp:  16  Temp:    SpO2:  93%    Last Pain:  Vitals:   03/08/17 0841  TempSrc:   PainSc: [place]

## 2017-03-15 ENCOUNTER — Telehealth: Payer: Self-pay | Admitting: *Deleted

## 2017-03-15 NOTE — Telephone Encounter (Signed)
Called c/o waking up in the morning x 2" wet " from sweating. Denies fever, cough or congestion. Denies chest pain or short of breath. Patient states he thinks he is getting plenty of fluids when asked. He is 1 week post-op. Claims incision is clean and dry no redness,puffiness and healing well. Encouraged patient to check his temp and call PCP and drink plenty of fluids.To go to the ER for any Emergency.

## 2017-03-28 DIAGNOSIS — E119 Type 2 diabetes mellitus without complications: Secondary | ICD-10-CM | POA: Insufficient documentation

## 2017-04-02 ENCOUNTER — Encounter: Payer: Self-pay | Admitting: Surgery

## 2017-04-02 ENCOUNTER — Ambulatory Visit: Payer: Self-pay | Admitting: Surgery

## 2017-04-02 VITALS — BP 126/76 | HR 77 | Temp 97.8°F | Resp 16 | Ht 69.0 in | Wt 179.0 lb

## 2017-04-02 DIAGNOSIS — I6522 Occlusion and stenosis of left carotid artery: Secondary | ICD-10-CM

## 2017-04-02 NOTE — Progress Notes (Signed)
   Patient name: Gregg Moon MRN: [id number] DOB: May 04, 1951 Sex: male  REASON FOR VISIT:     post op  HISTORY OF PRESENT ILLNESS:   Gregg Moon is a 66 y.o. male who is status post left carotid endarterectomy on 03/07/2017.  This was done for asymptomatic stenosis.  Intraoperative findings included an 80% stenosis.  He had significant ulcerated plaque at the bifurcation.  He has no complaints today.  CURRENT MEDICATIONS:    Current Outpatient Medications  Medication Sig Dispense Refill  . aspirin EC 81 MG tablet Take 81 mg by mouth daily.    . sildenafil (VIAGRA) 100 MG tablet Take 100 mg by mouth daily as needed for erectile dysfunction.    . simvastatin (ZOCOR) 40 MG tablet Take 40 mg by mouth daily after breakfast.     . valsartan-hydrochlorothiazide (DIOVAN-HCT) 160-12.5 MG tablet Take 1 tablet by mouth daily.     No current facility-administered medications for this visit.     REVIEW OF SYSTEMS:   [X]  denotes positive finding, [ ]  denotes negative finding Cardiac  Comments:  Chest pain or chest pressure:    Shortness of breath upon exertion:    Short of breath when lying flat:    Irregular heart rhythm:    Constitutional    Fever or chills:      PHYSICAL EXAM:   Vitals:   04/02/17 0913 04/02/17 0915  BP: 129/73 126/76  Pulse: 77   Resp: 16   Temp: 97.8 F (36.6 C)   TempSrc: Oral   SpO2: 97%   Weight: 179 lb (81.2 kg)   Height: 5\' 9"  (1.753 m)     GENERAL: The patient is a well-nourished male, in no acute distress. The vital signs are documented above. CARDIOVASCULAR: There is a regular rate and rhythm. PULMONARY: Non-labored respirations Incision is healing nicely. Neurologically intact  STUDIES:   None   MEDICAL ISSUES:   Status post left carotid endarterectomy for asymptomatic stenosis.  He has recovered nicely.  He has been cleared to go back to work.  He will follow up in 6 months with a repeat carotid  duplex.  Annamarie Major, MD [facility] [phone] Pager [phone]

## 2017-04-10 NOTE — Addendum Note (Signed)
Addended by: Lianne Cure A on: 04/10/2017 03:57 PM   Modules accepted: Orders

## 2017-10-01 ENCOUNTER — Ambulatory Visit (HOSPITAL_COMMUNITY)
Admission: RE | Admit: 2017-10-01 | Discharge: 2017-10-01 | Disposition: A | Discharge status: Home / Self Care | Payer: 59 | Source: Ambulatory Visit | Attending: Family | Admitting: Family

## 2017-10-01 ENCOUNTER — Ambulatory Visit: Payer: 59 | Admitting: Family

## 2017-10-01 ENCOUNTER — Encounter: Payer: Self-pay | Admitting: Family

## 2017-10-01 VITALS — BP 152/87 | HR 66 | Temp 97.1°F | Resp 18 | Ht 69.0 in | Wt 179.0 lb

## 2017-10-01 DIAGNOSIS — I672 Cerebral atherosclerosis: Secondary | ICD-10-CM | POA: Insufficient documentation

## 2017-10-01 DIAGNOSIS — I6522 Occlusion and stenosis of left carotid artery: Secondary | ICD-10-CM

## 2017-10-01 DIAGNOSIS — Z9889 Other specified postprocedural states: Secondary | ICD-10-CM | POA: Diagnosis not present

## 2017-10-01 NOTE — Progress Notes (Signed)
Chief Complaint: Follow up Extracranial Carotid Artery Stenosis   History of Present Illness  Gregg Moon is a 67 y.o. male who is status post left carotid endarterectomy on 03/07/2017 by Dr. Trula Slade.  This was done for asymptomatic stenosis.  Intraoperative findings included an 80% stenosis.  He had significant ulcerated plaque at the bifurcation.    He denies any known history of stroke or TIA. Specifically he denies a history of amaurosis fugax or monocular blindness, unilateral facial drooping, hemiplegia, or receptive or expressive aphasia.    He denies claudication type symptoms with walking.   122/71 was his blood pressure on 03-28-17 at [facility], was 126/76 in our office on 04-02-17, is elevated now, pt states he is anxious.    Pt states he has anxiety, states he was given literature for this in the past. I advised him to speak with his PCP re this.   Diabetic: "borderline", +family hx of DM; no A1C results on file, random glucose with BMP was 150 on 03-08-17.  Tobacco use: former smoker, quit in 1987, smoked x 20 years  Pt meds include: Statin : yes ASA: yes Other anticoagulants/antiplatelets: no   Past Medical History:  Diagnosis Date  . Anxiety    was on meds but has been off since ~ 2010  . Arthritis    "neck" (03/07/2017)  . Borderline diabetes   . Carotid artery occlusion   . Hyperlipidemia    takes Zocor daily  . Hypertension    takes Benicar daily  . Left carotid stenosis   . Neck pain    stenosis  . Weakness    numbness and tingling to the left arm    Social History Social History   Tobacco Use  . Smoking status: Former Smoker    Packs/day: 1.50    Years: 20.00    Pack years: 30.00    Types: Cigarettes    Last attempt to quit: 03/17/1986    Years since quitting: 31.5  . Smokeless tobacco: Never Used  Substance Use Topics  . Alcohol use: Yes    Alcohol/week: 0.6 oz    Types: 1 Cans of beer per week  . Drug use: No     Family History Family History  Problem Relation Age of Onset  . Heart disease Mother   . Diabetes Mother   . Heart disease Father   . Hypertension Sister   . Diabetes Sister     Surgical History Past Surgical History:  Procedure Laterality Date  . ANTERIOR CERVICAL CORPECTOMY N/A 07/03/2013   Procedure: C/5 Corpectomy w/strut graft + plating;  Surgeon: Eustace Moore, MD;  Location: [place] [facility];  Service: Neurosurgery;  Laterality: N/A;  . BACK SURGERY    . CAROTID ENDARTERECTOMY Left 03/07/2017  . COLONOSCOPY WITH PROPOFOL N/A 03/21/2016   Procedure: COLONOSCOPY WITH PROPOFOL;  Surgeon: Garlan Fair, MD;  Location: [facility];  Service: Endoscopy;  Laterality: N/A;  . ENDARTERECTOMY Left 03/07/2017   Procedure: ENDARTERECTOMY CAROTID LEFT;  Surgeon: Serafina Mitchell, MD;  Location: [place];  Service: Vascular;  Laterality: Left;  . INGUINAL HERNIA REPAIR Bilateral   . PATCH ANGIOPLASTY Left 03/07/2017   carotid  . PATCH ANGIOPLASTY Left 03/07/2017   Procedure: PATCH ANGIOPLASTY LEFT CAROTID ARTERY;  Surgeon: Serafina Mitchell, MD;  Location: [place];  Service: Vascular;  Laterality: Left;    Allergies  Allergen Reactions  . Hydrocodone     All pain meds make him "crazy".  Current Outpatient Medications  Medication Sig Dispense Refill  . aspirin EC 81 MG tablet Take 81 mg by mouth daily.    . sildenafil (VIAGRA) 100 MG tablet Take 100 mg by mouth daily as needed for erectile dysfunction.    . simvastatin (ZOCOR) 40 MG tablet Take 40 mg by mouth daily after breakfast.     . valsartan-hydrochlorothiazide (DIOVAN-HCT) 160-12.5 MG tablet Take 1 tablet by mouth daily.     No current facility-administered medications for this visit.     Review of Systems : See HPI for pertinent positives and negatives.  Physical Examination  Vitals:   10/01/17 1131 10/01/17 1132  BP: (!) 151/84 (!) 152/87  Pulse: 66   Resp: 18   Temp: (!) 97.1 F (36.2 C)   TempSrc: Oral    SpO2: 97%   Weight: 179 lb (81.2 kg)   Height: 5\' 9"  (1.753 m)    Body mass index is 26.43 kg/m.  General: WDWN male in NAD GAIT: normal Eyes: PERRLA HENT: No gross abnormalities.  Pulmonary:  Respirations are non-labored, good air movement in all fields, CTAB, no rales, nrhonchi, or wheezing. Cardiac: regular rhythm, no detected murmur.  VASCULAR EXAM Carotid Bruits Right Left   Negative Negative     Abdominal aortic pulse is not palpable. Radial pulses are 2+ palpable and equal.                                                                                                                            LE Pulses Right Left       FEMORAL  2+ palpable  2+ palpable        POPLITEAL  not palpable   not palpable       POSTERIOR TIBIAL  2+ palpable   2+ palpable        DORSALIS PEDIS      ANTERIOR TIBIAL 2+ palpable  2+ palpable     Gastrointestinal: soft, nontender, BS WNL, no r/g, no palpable masses. Musculoskeletal: No muscle atrophy/wasting. M/S 5/5 throughout, extremities without ischemic changes. Skin: No rashes, no ulcers, no cellulitis.   Neurologic:  A&O X 3; appropriate affect, sensation is normal; speech is normal, CN 2-12 intact, pain and light touch intact in extremities, motor exam as listed above. Psychiatric: Normal thought content, mood appropriate to clinical situation.    Assessment: Gregg Moon is a 67 y.o. male who is status post left carotid endarterectomy on 03/07/2017.   He has no history of stroke or TIA.   He has borderline DM with + family hx of DM. Fortunately he quit tobacco use in 1987 (smoked x 20 years), has a normal BMI, and takes a statin and ASA daily.     DATA Carotid Duplex (10/01/17): Right ICA: 1-39% stenosis Left ICA: CEA site, with 1-39% stenosis Bilateral vertebral artery flow is antegrade. Plaque visualized proximally in the left vertebral artery. Bilateral subclavian artery waveforms are normal.  Improvement in left  ICA stenosis compared to the preoperative exam on 02-19-17.    Plan: Follow-up in 6 months with Carotid Duplex scan.     I discussed in depth with the patient the nature of atherosclerosis, and emphasized the importance of maximal medical management including strict control of blood pressure, blood glucose, and lipid levels, obtaining regular exercise, and continued cessation of smoking.  The patient is aware that without maximal medical management the underlying atherosclerotic disease process will progress, limiting the benefit of any interventions. The patient was given information about stroke prevention and what symptoms should prompt the patient to seek immediate medical care. Thank you for allowing [place] to participate in this patient's care.  Clemon Chambers, RN, MSN, FNP-C [facility] [place] Office: [facility] Physician: Trula Slade   10/01/17 11:35 AM

## 2017-10-01 NOTE — Patient Instructions (Addendum)
Stroke Prevention Some health problems and behaviors may make it more likely for you to have a stroke. Below are ways to lessen your risk of having a stroke.  Be active for at least 30 minutes on most or all days.  Do not smoke. Try not to be around others who smoke.  Do not drink too much alcohol. ? Do not have more than 2 drinks a day if you are a man. ? Do not have more than 1 drink a day if you are a woman and are not pregnant.  Eat healthy foods, such as fruits and vegetables. If you were put on a specific diet, follow the diet as told.  Keep your cholesterol levels under control through diet and medicines. Look for foods that are low in saturated fat, trans fat, cholesterol, and are high in fiber.  If you have diabetes, follow all diet plans and take your medicine as told.  Ask your doctor if you need treatment to lower your blood pressure. If you have high blood pressure (hypertension), follow all diet plans and take your medicine as told by your doctor.  If you are 60-36 years old, have your blood pressure checked every 3-5 years. If you are age 16 or older, have your blood pressure checked every year.  Keep a healthy weight. Eat foods that are low in calories, salt, saturated fat, trans fat, and cholesterol.  Do not take drugs.  Avoid birth control pills, if this applies. Talk to your doctor about the risks of taking birth control pills.  Talk to your doctor if you have sleep problems (sleep apnea).  Take all medicine as told by your doctor. ? You may be told to take aspirin or blood thinner medicine. Take this medicine as told by your doctor. ? Understand your medicine instructions.  Make sure any other conditions you have are being taken care of.  Get help right away if:  You suddenly lose feeling (you feel numb) or have weakness in your face, arm, or leg.  Your face or eyelid hangs down to one side.  You suddenly feel confused.  You have trouble talking  (aphasia) or understanding what people are saying.  You suddenly have trouble seeing in one or both eyes.  You suddenly have trouble walking.  You are dizzy.  You lose your balance or your movements are clumsy (uncoordinated).  You suddenly have a very bad headache and you do not know the cause.  You have new chest pain.  Your heart feels like it is fluttering or skipping a beat (irregular heartbeat). Do not wait to see if the symptoms above go away. Get help right away. Call your local emergency services (911 in [place] Do not drive yourself to the hospital. This information is not intended to replace advice given to you by your health care provider. Make sure you discuss any questions you have with your health care provider. Document Released: 11/14/2011 Document Revised: 10/21/2015 Document Reviewed: 11/15/2012 Elsevier Interactive Patient Education  2018 [place] After being diagnosed with an anxiety disorder, you may be relieved to know why you have felt or behaved a certain way. It is natural to also feel overwhelmed about the treatment ahead and what it will mean for your life. With care and support, you can manage this condition and recover from it. How to cope with anxiety Dealing with stress Stress is your body's reaction to life changes and events, both good  and bad. Stress can last just a few hours or it can be ongoing. Stress can play a major role in anxiety, so it is important to learn both how to cope with stress and how to think about it differently. Talk with your health care provider or a counselor to learn more about stress reduction. He or she may suggest some stress reduction techniques, such as:  Music therapy. This can include creating or listening to music that you enjoy and that inspires you.  Mindfulness-based meditation. This involves being aware of your normal breaths, rather than trying to control your breathing. It can be done  while sitting or walking.  Centering prayer. This is a kind of meditation that involves focusing on a word, phrase, or sacred image that is meaningful to you and that brings you peace.  Deep breathing. To do this, expand your stomach and inhale slowly through your nose. Hold your breath for 3-5 seconds. Then exhale slowly, allowing your stomach muscles to relax.  Self-talk. This is a skill where you identify thought patterns that lead to anxiety reactions and correct those thoughts.  Muscle relaxation. This involves tensing muscles then relaxing them.  Choose a stress reduction technique that fits your lifestyle and personality. Stress reduction techniques take time and practice. Set aside 5-15 minutes a day to do them. Therapists can offer training in these techniques. The training may be covered by some insurance plans. Other things you can do to manage stress include:  Keeping a stress diary. This can help you learn what triggers your stress and ways to control your response.  Thinking about how you respond to certain situations. You may not be able to control everything, but you can control your reaction.  Making time for activities that help you relax, and not feeling guilty about spending your time in this way.  Therapy combined with coping and stress-reduction skills provides the best chance for successful treatment. Medicines Medicines can help ease symptoms. Medicines for anxiety include:  Anti-anxiety drugs.  Antidepressants.  Beta-blockers.  Medicines may be used as the main treatment for anxiety disorder, along with therapy, or if other treatments are not working. Medicines should be prescribed by a health care provider. Relationships Relationships can play a big part in helping you recover. Try to spend more time connecting with trusted friends and family members. Consider going to couples counseling, taking family education classes, or going to family therapy. Therapy can  help you and others better understand the condition. How to recognize changes in your condition Everyone has a different response to treatment for anxiety. Recovery from anxiety happens when symptoms decrease and stop interfering with your daily activities at home or work. This may mean that you will start to:  Have better concentration and focus.  Sleep better.  Be less irritable.  Have more energy.  Have improved memory.  It is important to recognize when your condition is getting worse. Contact your health care provider if your symptoms interfere with home or work and you do not feel like your condition is improving. Where to find help and support: You can get help and support from these sources:  Self-help groups.  Online and [employer].  A trusted spiritual leader.  Couples counseling.  Family education classes.  Family therapy.  Follow these instructions at home:  Eat a healthy diet that includes plenty of vegetables, fruits, whole grains, low-fat dairy products, and lean protein. Do not eat a lot of foods that are high in  solid fats, added sugars, or salt.  Exercise. Most adults should do the following: ? Exercise for at least 150 minutes each week. The exercise should increase your heart rate and make you sweat (moderate-intensity exercise). ? Strengthening exercises at least twice a week.  Cut down on caffeine, tobacco, alcohol, and other potentially harmful substances.  Get the right amount and quality of sleep. Most adults need 7-9 hours of sleep each night.  Make choices that simplify your life.  Take over-the-counter and prescription medicines only as told by your health care provider.  Avoid caffeine, alcohol, and certain over-the-counter cold medicines. These may make you feel worse. Ask your pharmacist which medicines to avoid.  Keep all follow-up visits as told by your health care provider. This is important. Questions to ask your health  care provider  Would I benefit from therapy?  How often should I follow up with a health care provider?  How long do I need to take medicine?  Are there any long-term side effects of my medicine?  Are there any alternatives to taking medicine? Contact a health care provider if:  You have a hard time staying focused or finishing daily tasks.  You spend many hours a day feeling worried about everyday life.  You become exhausted by worry.  You start to have headaches, feel tense, or have nausea.  You urinate more than normal.  You have diarrhea. Get help right away if:  You have a racing heart and shortness of breath.  You have thoughts of hurting yourself or others. If you ever feel like you may hurt yourself or others, or have thoughts about taking your own life, get help right away. You can go to your nearest emergency department or call:  Your local emergency services (911 in the [place]  A suicide crisis helpline, such as the [place] at (417)533-9576. This is open 24-hours a day.  Summary  Taking steps to deal with stress can help calm you.  Medicines cannot cure anxiety disorders, but they can help ease symptoms.  Family, friends, and partners can play a big part in helping you recover from an anxiety disorder. This information is not intended to replace advice given to you by your health care provider. Make sure you discuss any questions you have with your health care provider. Document Released: 05/09/2016 Document Revised: 05/09/2016 Document Reviewed: 05/09/2016 Elsevier Interactive Patient Education  [employer].

## 2017-11-09 ENCOUNTER — Other Ambulatory Visit: Payer: Self-pay | Admitting: Family

## 2017-11-09 DIAGNOSIS — I6522 Occlusion and stenosis of left carotid artery: Secondary | ICD-10-CM

## 2018-04-08 ENCOUNTER — Ambulatory Visit (HOSPITAL_COMMUNITY): Payer: 59 | Attending: Internal Medicine

## 2018-04-08 ENCOUNTER — Ambulatory Visit: Payer: 59 | Admitting: Family

## 2018-04-09 ENCOUNTER — Encounter: Payer: Self-pay | Admitting: Family

## 2018-05-16 ENCOUNTER — Ambulatory Visit (HOSPITAL_COMMUNITY)
Admission: RE | Admit: 2018-05-16 | Discharge: 2018-05-16 | Disposition: A | Discharge status: Home / Self Care | Payer: 59 | Source: Ambulatory Visit | Attending: Family | Admitting: Family

## 2018-05-16 ENCOUNTER — Ambulatory Visit: Payer: 59 | Admitting: Family

## 2018-05-16 ENCOUNTER — Encounter: Payer: Self-pay | Admitting: Family

## 2018-05-16 VITALS — BP 150/70 | HR 64 | Temp 97.1°F | Ht 69.0 in | Wt 178.0 lb

## 2018-05-16 DIAGNOSIS — Z9889 Other specified postprocedural states: Secondary | ICD-10-CM

## 2018-05-16 DIAGNOSIS — I6522 Occlusion and stenosis of left carotid artery: Secondary | ICD-10-CM | POA: Diagnosis present

## 2018-05-16 NOTE — Patient Instructions (Signed)

## 2018-05-16 NOTE — Progress Notes (Signed)
Chief Complaint: Follow up Extracranial Carotid Artery Stenosis   History of Present Illness  Gregg Moon is a 67 y.o. male who is status postleft carotid endarterectomy on 03/07/2017 by Dr. Trula Slade. This was done for asymptomatic stenosis. Intraoperative findings included an 80% stenosis. He had significant ulcerated plaque at the bifurcation.   He denies any known history of stroke or TIA. Specifically he deniesa history of amaurosis fugax or monocular blindness, unilateral facial drooping, hemiplegia, orreceptive or expressive aphasia.    He denies claudication type symptoms with walking.  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   122/71 was his blood pressure on 03-28-17 at [facility], was 126/76 in our office on 04-02-17, is elevated now, pt states he is anxious.    Diabetic: "borderline", +family hx of DM; no A1C results on file, random glucose with BMP was 150 on 03-08-17.  Tobacco use: former smoker, quit in 1987, smoked x 20 years  Pt meds include: Statin : yes ASA: yes Other anticoagulants/antiplatelets: no   Past Medical History:  Diagnosis Date  . Anxiety    was on meds but has been off since ~ 2010  . Arthritis    "neck" (03/07/2017)  . Borderline diabetes   . Carotid artery occlusion   . Hyperlipidemia    takes Zocor daily  . Hypertension    takes Benicar daily  . Left carotid stenosis   . Neck pain    stenosis  . Weakness    numbness and tingling to the left arm    Social History Social History   Tobacco Use  . Smoking status: Former Smoker    Packs/day: 1.50    Years: 20.00    Pack years: 30.00    Types: Cigarettes    Last attempt to quit: 03/17/1986    Years since quitting: 32.1  . Smokeless tobacco: Never Used  Substance Use Topics  . Alcohol use: Yes    Alcohol/week: 1.0 standard drinks    Types: 1 Cans of beer per week  . Drug use: No    Family History Family  History  Problem Relation Age of Onset  . Heart disease Mother   . Diabetes Mother   . Heart disease Father   . Hypertension Sister   . Diabetes Sister     Surgical History Past Surgical History:  Procedure Laterality Date  . ANTERIOR CERVICAL CORPECTOMY N/A 07/03/2013   Procedure: C/5 Corpectomy w/strut graft + plating;  Surgeon: Eustace Moore, MD;  Location: [place] [facility];  Service: Neurosurgery;  Laterality: N/A;  . BACK SURGERY    . CAROTID ENDARTERECTOMY Left 03/07/2017  . COLONOSCOPY WITH PROPOFOL N/A 03/21/2016   Procedure: COLONOSCOPY WITH PROPOFOL;  Surgeon: Garlan Fair, MD;  Location: [facility];  Service: Endoscopy;  Laterality: N/A;  . ENDARTERECTOMY Left 03/07/2017   Procedure: ENDARTERECTOMY CAROTID LEFT;  Surgeon: Serafina Mitchell, MD;  Location: [place];  Service: Vascular;  Laterality: Left;  . INGUINAL HERNIA REPAIR Bilateral   . PATCH ANGIOPLASTY Left 03/07/2017   carotid  . PATCH ANGIOPLASTY Left 03/07/2017   Procedure: PATCH ANGIOPLASTY LEFT CAROTID ARTERY;  Surgeon: Serafina Mitchell, MD;  Location: [place];  Service: Vascular;  Laterality: Left;    Allergies  Allergen Reactions  . Hydrocodone     All pain meds make him "crazy".    Current Outpatient Medications  Medication Sig Dispense Refill  . aspirin EC 81 MG tablet  Take 81 mg by mouth daily.    . sildenafil (VIAGRA) 100 MG tablet Take 100 mg by mouth daily as needed for erectile dysfunction.    . simvastatin (ZOCOR) 40 MG tablet Take 40 mg by mouth daily after breakfast.     . valsartan-hydrochlorothiazide (DIOVAN-HCT) 160-12.5 MG tablet Take 1 tablet by mouth daily.     No current facility-administered medications for this visit.     Review of Systems : See HPI for pertinent positives and negatives.  Physical Examination  Vitals:   05/16/18 1331 05/16/18 1334  BP: 140/75 (!) 150/70  Pulse: 64   Temp: (!) 97.1 F (36.2 C)   SpO2: 99%   Weight: 178 lb (80.7 kg)   Height: 5\' 9"  (1.753 m)     Body mass index is 26.29 kg/m.  General: WDWN male in NAD GAIT: normal Eyes: PERRLA HENT: No gross abnormalities.  Pulmonary:  Respirations are non-labored, good air movement in all fields, CTAB, no rales, nrhonchi, or wheezing. Cardiac: regular rhythm, no detected murmur.  VASCULAR EXAM Carotid Bruits Right Left   Negative Negative     Abdominal aortic pulse is not palpable. Radial pulses are 2+ palpable and equal.                                                                                                                                          LE Pulses Right Left       FEMORAL  2+ palpable  2+ palpable        POPLITEAL  not palpable   not palpable       POSTERIOR TIBIAL  2+ palpable   2+ palpable        DORSALIS PEDIS      ANTERIOR TIBIAL 2+ palpable  2+ palpable     Gastrointestinal: soft, nontender, BS WNL, no r/g, no palpable masses. Musculoskeletal: No muscle atrophy/wasting. M/S 5/5 throughout, extremities without ischemic changes. Skin: No rashes, no ulcers, no cellulitis.   Neurologic:  A&O X 3; appropriate affect, sensation is normal; speech is normal, CN 2-12 intact, pain and light touch intact in extremities, motor exam as listed above. Psychiatric: Normal thought content, mood appropriate to clinical situation    Assessment: Gregg Moon is a 67 y.o. male who is status postleft carotid endarterectomy on 03/07/2017.   He has no history of stroke or TIA.   He has borderline DM with + family hx of DM. Fortunately he quit tobacco use in 1987 (smoked x 20 years), has a normal BMI, and takes a statin and ASA daily.     DATA  Carotid Duplex (05-16-18): Right Carotid: Velocities in the right ICA are consistent with a 1-39% stenosis. Left Carotid: Patent left carotid endarterectomy site with velocity in the left               ICA consistent with a 1-39% stenosis.  Bilateral vertebral artery flow is antegrade.  Bilateral subclavian  artery waveforms are normal.  No change compared to the exam on 10-01-17.     Plan:  Follow-up in 1 year with Carotid Duplex scan.   I discussed in depth with the patient the nature of atherosclerosis, and emphasized the importance of maximal medical management including strict control of blood pressure, blood glucose, and lipid levels, obtaining regular exercise, and continued cessation of smoking.  The patient is aware that without maximal medical management the underlying atherosclerotic disease process will progress, limiting the benefit of any interventions. The patient was given information about stroke prevention and what symptoms should prompt the patient to seek immediate medical care. Thank you for allowing [place] to participate in this patient's care.  Clemon Chambers, RN, MSN, FNP-C [facility] [place] Office: [phone]  Clinic Physician: Oneida Alar  05/16/18 1:45 PM

## 2019-05-27 ENCOUNTER — Ambulatory Visit: Payer: 59 | Attending: Internal Medicine

## 2019-05-27 DIAGNOSIS — Z20822 Contact with and (suspected) exposure to covid-19: Secondary | ICD-10-CM

## 2019-05-28 LAB — NOVEL CORONAVIRUS, NAA: SARS-CoV-2, NAA: DETECTED — AB

## 2019-07-27 ENCOUNTER — Ambulatory Visit: Payer: 59 | Attending: Internal Medicine

## 2019-07-27 DIAGNOSIS — Z23 Encounter for immunization: Secondary | ICD-10-CM | POA: Insufficient documentation

## 2019-07-27 NOTE — Progress Notes (Signed)
   Covid-19 Vaccination Clinic  Name:  KENO TUCKERMAN    MRN: [id number] DOB: Feb 05, 1951  07/27/2019  Mr. Veloso was observed post Covid-19 immunization for 15 minutes without incidence. He was provided with Vaccine Information Sheet and instruction to access the V-Safe system.   Mr. Valone was instructed to call 911 with any severe reactions post vaccine: Marland Kitchen Difficulty breathing  . Swelling of your face and throat  . A fast heartbeat  . A bad rash all over your body  . Dizziness and weakness    Immunizations Administered    Name Date Dose VIS Date Route   Pfizer COVID-19 Vaccine 07/27/2019  8:55 AM 0.3 mL 05/09/2019 Intramuscular   Manufacturer: [place]   Lot: [id number]   [place]: [id number]

## 2019-08-20 ENCOUNTER — Ambulatory Visit: Payer: 59 | Attending: Internal Medicine

## 2019-08-20 DIAGNOSIS — Z23 Encounter for immunization: Secondary | ICD-10-CM

## 2019-08-20 NOTE — Progress Notes (Signed)
   Covid-19 Vaccination Clinic  Name:  Gregg Moon    MRN: [id number] DOB: 1951/03/13  08/20/2019  [name] was observed post Covid-19 immunization for 15 minutes without incident. He was provided with Vaccine Information Sheet and instruction to access the V-Safe system.   [name] was instructed to call 911 with any severe reactions post vaccine: Marland Kitchen Difficulty breathing  . Swelling of face and throat  . A fast heartbeat  . A bad rash all over body  . Dizziness and weakness   Immunizations Administered    Name Date Dose VIS Date Route   Pfizer COVID-19 Vaccine 08/20/2019  4:36 PM 0.3 mL 05/09/2019 Intramuscular   Manufacturer: [place]   Lot: [id number]   [place]: [id number]

## 2019-08-28 DIAGNOSIS — E7800 Pure hypercholesterolemia, unspecified: Secondary | ICD-10-CM | POA: Diagnosis not present

## 2019-08-28 DIAGNOSIS — Z1159 Encounter for screening for other viral diseases: Secondary | ICD-10-CM | POA: Diagnosis not present

## 2019-08-28 DIAGNOSIS — Z86010 Personal history of colonic polyps: Secondary | ICD-10-CM | POA: Diagnosis not present

## 2019-08-28 DIAGNOSIS — N529 Male erectile dysfunction, unspecified: Secondary | ICD-10-CM | POA: Diagnosis not present

## 2019-08-28 DIAGNOSIS — E1169 Type 2 diabetes mellitus with other specified complication: Secondary | ICD-10-CM | POA: Diagnosis not present

## 2019-08-28 DIAGNOSIS — R69 Illness, unspecified: Secondary | ICD-10-CM | POA: Diagnosis not present

## 2019-08-28 DIAGNOSIS — Z0000 Encounter for general adult medical examination without abnormal findings: Secondary | ICD-10-CM | POA: Diagnosis not present

## 2019-08-28 DIAGNOSIS — I10 Essential (primary) hypertension: Secondary | ICD-10-CM | POA: Diagnosis not present

## 2019-08-28 DIAGNOSIS — N400 Enlarged prostate without lower urinary tract symptoms: Secondary | ICD-10-CM | POA: Diagnosis not present

## 2019-09-29 DIAGNOSIS — N529 Male erectile dysfunction, unspecified: Secondary | ICD-10-CM | POA: Diagnosis not present

## 2019-09-29 DIAGNOSIS — R69 Illness, unspecified: Secondary | ICD-10-CM | POA: Diagnosis not present

## 2019-10-23 DIAGNOSIS — R69 Illness, unspecified: Secondary | ICD-10-CM | POA: Diagnosis not present

## 2019-11-13 ENCOUNTER — Other Ambulatory Visit: Payer: Self-pay | Admitting: Family Medicine

## 2019-11-13 ENCOUNTER — Ambulatory Visit
Admission: RE | Admit: 2019-11-13 | Discharge: 2019-11-13 | Disposition: A | Discharge status: Home / Self Care | Payer: 59 | Source: Ambulatory Visit | Attending: Family Medicine | Admitting: Family Medicine

## 2019-11-13 DIAGNOSIS — M542 Cervicalgia: Secondary | ICD-10-CM | POA: Diagnosis not present

## 2019-12-02 DIAGNOSIS — R69 Illness, unspecified: Secondary | ICD-10-CM | POA: Diagnosis not present

## 2020-04-11 ENCOUNTER — Ambulatory Visit: Payer: 59 | Attending: Internal Medicine

## 2020-04-11 DIAGNOSIS — Z23 Encounter for immunization: Secondary | ICD-10-CM

## 2020-04-11 NOTE — Progress Notes (Signed)
   Covid-19 Vaccination Clinic  Name:  CLEDIS SOHN    MRN: [id number] DOB: 07/10/1950  04/11/2020  Mr. Maish was observed post Covid-19 immunization for 15 minutes without incident. He was provided with Vaccine Information Sheet and instruction to access the V-Safe system.   Mr. Wessler was instructed to call 911 with any severe reactions post vaccine: Marland Kitchen Difficulty breathing  . Swelling of face and throat  . A fast heartbeat  . A bad rash all over body  . Dizziness and weakness   Immunizations Administered    Name Date Dose VIS Date Route   Pfizer COVID-19 Vaccine 04/11/2020  9:20 AM 0.3 mL 03/17/2020 Intramuscular   Manufacturer: [place]   Lot: [id number]   [place]: [id number]
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
## 2020-07-20 ENCOUNTER — Other Ambulatory Visit: Payer: Self-pay

## 2020-07-20 DIAGNOSIS — I6522 Occlusion and stenosis of left carotid artery: Secondary | ICD-10-CM

## 2020-07-20 NOTE — Progress Notes (Signed)
error 

## 2020-07-21 ENCOUNTER — Other Ambulatory Visit: Payer: Self-pay | Admitting: *Deleted

## 2020-07-21 ENCOUNTER — Ambulatory Visit (HOSPITAL_COMMUNITY)
Admission: RE | Admit: 2020-07-21 | Discharge: 2020-07-21 | Disposition: A | Discharge status: Home / Self Care | Payer: Medicare HMO | Source: Ambulatory Visit | Attending: Surgery | Admitting: Surgery

## 2020-07-21 ENCOUNTER — Other Ambulatory Visit: Payer: Self-pay

## 2020-07-21 DIAGNOSIS — I6522 Occlusion and stenosis of left carotid artery: Secondary | ICD-10-CM

## 2020-07-22 ENCOUNTER — Ambulatory Visit (HOSPITAL_COMMUNITY)
Admission: RE | Admit: 2020-07-22 | Discharge: 2020-07-22 | Disposition: A | Discharge status: Home / Self Care | Payer: Medicare HMO | Source: Ambulatory Visit | Attending: Surgery | Admitting: Surgery

## 2020-07-22 ENCOUNTER — Ambulatory Visit: Payer: Medicare HMO | Admitting: Physician Assistant

## 2020-07-22 VITALS — BP 131/70 | HR 63 | Temp 98.0°F | Resp 20 | Ht 69.0 in | Wt 172.7 lb

## 2020-07-22 DIAGNOSIS — I6522 Occlusion and stenosis of left carotid artery: Secondary | ICD-10-CM | POA: Diagnosis not present

## 2020-07-22 NOTE — Progress Notes (Signed)
HISTORY AND PHYSICAL     CC:  follow up. Requesting Provider:  Lavone Orn, MD  HPI: This is a 70 y.o. male here for follow up for carotid artery stenosis.  Pt is s/p left CEA for asymptomatic carotid artery stenosis on 03/07/2017 by Dr. Trula Slade.    Pt was last seen December 2019 and at that time he was doing well without any stroke sx.  He was not having any claudication or rest pain.    Pt returns today for follow up.    Pt denies any amaurosis fugax, speech difficulties, weakness, numbness, paralysis or clumsiness or facial droop.    The pt is not on a statin for cholesterol management.  He stopped taking this bc it made him drowsy. The pt is on a daily aspirin.   Other AC:  none The pt is on ARB for hypertension.   The pt is not diabetic.   Tobacco hx:  Former-quit 1987  Pt does not have family hx of AAA.  Past Medical History:  Diagnosis Date  . Anxiety    was on meds but has been off since ~ 2010  . Arthritis    "neck" (03/07/2017)  . Borderline diabetes   . Carotid artery occlusion   . Hyperlipidemia    takes Zocor daily  . Hypertension    takes Benicar daily  . Left carotid stenosis   . Neck pain    stenosis  . Weakness    numbness and tingling to the left arm    Past Surgical History:  Procedure Laterality Date  . ANTERIOR CERVICAL CORPECTOMY N/A 07/03/2013   Procedure: C/5 Corpectomy w/strut graft + plating;  Surgeon: Eustace Moore, MD;  Location: [place] [facility];  Service: Neurosurgery;  Laterality: N/A;  . BACK SURGERY    . CAROTID ENDARTERECTOMY Left 03/07/2017  . COLONOSCOPY WITH PROPOFOL N/A 03/21/2016   Procedure: COLONOSCOPY WITH PROPOFOL;  Surgeon: Garlan Fair, MD;  Location: [facility];  Service: Endoscopy;  Laterality: N/A;  . ENDARTERECTOMY Left 03/07/2017   Procedure: ENDARTERECTOMY CAROTID LEFT;  Surgeon: Serafina Mitchell, MD;  Location: [place];  Service: Vascular;  Laterality: Left;  . INGUINAL HERNIA REPAIR Bilateral   . PATCH  ANGIOPLASTY Left 03/07/2017   carotid  . PATCH ANGIOPLASTY Left 03/07/2017   Procedure: PATCH ANGIOPLASTY LEFT CAROTID ARTERY;  Surgeon: Serafina Mitchell, MD;  Location: [place];  Service: Vascular;  Laterality: Left;    Allergies  Allergen Reactions  . Naproxen Other (See Comments)    States "makes him act out"  . Atorvastatin Other (See Comments)  . Hydrocodone     All pain meds make him "crazy".  . Other Other (See Comments)    A blood pressure pill but unsure of name-gave bad headache A blood pressure pill but unsure of name-gave bad headache  . Ramipril Other (See Comments)  . Sertraline Hcl Other (See Comments)    Current Outpatient Medications  Medication Sig Dispense Refill  . aspirin EC 81 MG tablet Take 81 mg by mouth daily.    Marland Kitchen losartan-hydrochlorothiazide (HYZAAR) 100-12.5 MG tablet     . sildenafil (VIAGRA) 100 MG tablet Take 100 mg by mouth daily as needed for erectile dysfunction.    . tamsulosin (FLOMAX) 0.4 MG CAPS capsule Take 0.4 mg by mouth daily.    . simvastatin (ZOCOR) 40 MG tablet Take 40 mg by mouth daily after breakfast.  (Patient not taking: Reported on 07/22/2020)     No current  facility-administered medications for this visit.    Family History  Problem Relation Age of Onset  . Heart disease Mother   . Diabetes Mother   . Heart disease Father   . Hypertension Sister   . Diabetes Sister     Social History   Socioeconomic History  . Marital status: Married    Spouse name: Not on file  . Number of children: Not on file  . Years of education: Not on file  . Highest education level: Not on file  Occupational History  . Not on file  Tobacco Use  . Smoking status: Former Smoker    Packs/day: 1.50    Years: 20.00    Pack years: 30.00    Types: Cigarettes    Quit date: 03/17/1986    Years since quitting: 34.3  . Smokeless tobacco: Never Used  Vaping Use  . Vaping Use: Never used  Substance and Sexual Activity  . Alcohol use: Yes     Alcohol/week: 1.0 standard drink    Types: 1 Cans of beer per week  . Drug use: No  . Sexual activity: Yes  Other Topics Concern  . Not on file  Social History Narrative  . Not on file   Social Determinants of Health   Financial Resource Strain: Not on file  Food Insecurity: Not on file  Transportation Needs: Not on file  Physical Activity: Not on file  Stress: Not on file  Social Connections: Not on file  Intimate Partner Violence: Not on file     REVIEW OF SYSTEMS:   [X]  denotes positive finding, [ ]  denotes negative finding Cardiac  Comments:  Chest pain or chest pressure:    Shortness of breath upon exertion:    Short of breath when lying flat:    Irregular heart rhythm:        Vascular    Pain in calf, thigh, or hip brought on by ambulation:    Pain in feet at night that wakes you up from your sleep:     Blood clot in your veins:    Leg swelling:         Pulmonary    Oxygen at home:    Productive cough:     Wheezing:         Neurologic    Sudden weakness in arms or legs:     Sudden numbness in arms or legs:     Sudden onset of difficulty speaking or slurred speech:    Temporary loss of vision in one eye:     Problems with dizziness:         Gastrointestinal    Blood in stool:     Vomited blood:         Genitourinary    Burning when urinating:     Blood in urine:        Psychiatric    Major depression:         Hematologic    Bleeding problems:    Problems with blood clotting too easily:        Skin    Rashes or ulcers:        Constitutional    Fever or chills:      PHYSICAL EXAMINATION:  Today's Vitals   07/22/20 1533 07/22/20 1536  BP: (!) 143/75 131/70  Pulse: 63   Resp: 20   Temp: 98 F (36.7 C)   TempSrc: Temporal   SpO2: 99%   Weight: 172 lb 11.2 oz (  78.3 kg)   Height: 5\' 9"  (1.753 m)    Body mass index is 25.5 kg/m.   General:  WDWN in NAD; vital signs documented above Gait: Not observed HENT: WNL,  normocephalic Pulmonary: normal non-labored breathing Cardiac: regular HR, without carotid bruits Abdomen: soft, NT, no masses; aortic pulse is not palpable Skin: without rashes Vascular Exam/Pulses:  Right Left  Radial 2+ (normal) 2+ (normal)  Ulnar 2+ (normal) 2+ (normal)  Popliteal Unable to palpate Unable to palpate  DP 2+ (normal) 2+ (normal)  PT 2+ (normal) 2+ (normal)   Extremities: without ischemic changes, without Gangrene , without cellulitis; without open wounds Musculoskeletal: no muscle wasting or atrophy  Neurologic: A&O X 3 Psychiatric:  The pt has Normal affect.   Non-Invasive Vascular Imaging:   Carotid Duplex on 07/22/2020: Right:  1-39% ICA stenosis Left:  Near normal   Previous Carotid duplex on 05/16/2018: Right: 1-39% ICA stenosis Left:   1-39% ICA stenosis    ASSESSMENT/PLAN:: 70 y.o. male here for follow up carotid artery stenosis with hx of left CEA for asymptomatic carotid artery stenosis on 03/07/2017 by Dr. Trula Slade.  -duplex today reveals 1-39% right ICA stenosis and near normal ICA on the left. -discussed s/s of stroke with pt and he understands should he develop any of these sx, he will go to the nearest ER. -pt will f/u in one year with carotid duplex -pt will call sooner should they have any issues. -continue asa.  Will defer to PCP for statin given he has stopped taking this.   Leontine Locket, [facility] [phone]  Clinic MD:  Oneida Alar

## 2020-07-30 ENCOUNTER — Other Ambulatory Visit: Payer: Self-pay | Admitting: Internal Medicine

## 2020-07-30 DIAGNOSIS — Z136 Encounter for screening for cardiovascular disorders: Secondary | ICD-10-CM

## 2020-08-12 ENCOUNTER — Ambulatory Visit
Admission: RE | Admit: 2020-08-12 | Discharge: 2020-08-12 | Disposition: A | Discharge status: Home / Self Care | Payer: Medicare HMO | Source: Ambulatory Visit | Attending: Internal Medicine | Admitting: Internal Medicine

## 2020-08-12 DIAGNOSIS — Z136 Encounter for screening for cardiovascular disorders: Secondary | ICD-10-CM

## 2020-09-20 DIAGNOSIS — M170 Bilateral primary osteoarthritis of knee: Secondary | ICD-10-CM | POA: Diagnosis not present

## 2020-09-20 DIAGNOSIS — I10 Essential (primary) hypertension: Secondary | ICD-10-CM | POA: Diagnosis not present

## 2020-09-20 DIAGNOSIS — E1169 Type 2 diabetes mellitus with other specified complication: Secondary | ICD-10-CM | POA: Diagnosis not present

## 2020-09-20 DIAGNOSIS — E119 Type 2 diabetes mellitus without complications: Secondary | ICD-10-CM | POA: Diagnosis not present

## 2020-09-20 DIAGNOSIS — N400 Enlarged prostate without lower urinary tract symptoms: Secondary | ICD-10-CM | POA: Diagnosis not present

## 2020-09-20 DIAGNOSIS — E7800 Pure hypercholesterolemia, unspecified: Secondary | ICD-10-CM | POA: Diagnosis not present

## 2020-11-10 DIAGNOSIS — I10 Essential (primary) hypertension: Secondary | ICD-10-CM | POA: Diagnosis not present

## 2020-11-10 DIAGNOSIS — E119 Type 2 diabetes mellitus without complications: Secondary | ICD-10-CM | POA: Diagnosis not present

## 2020-11-10 DIAGNOSIS — E7800 Pure hypercholesterolemia, unspecified: Secondary | ICD-10-CM | POA: Diagnosis not present

## 2020-11-10 DIAGNOSIS — M170 Bilateral primary osteoarthritis of knee: Secondary | ICD-10-CM | POA: Diagnosis not present

## 2020-11-10 DIAGNOSIS — E1169 Type 2 diabetes mellitus with other specified complication: Secondary | ICD-10-CM | POA: Diagnosis not present

## 2021-04-04 IMAGING — US US ABDOMINAL AORTA SCREENING AAA
1 series · 14 of 25 positions shown · non-contrast
Comparison: None.

CLINICAL DATA: Male between 65-75 years of age with a smoking
history.

EXAM:
US ABDOMINAL AORTA MEDICARE SCREENING
TECHNIQUE: Ultrasound examination of the abdominal aorta was performed as a
screening evaluation for abdominal aortic aneurysm.

[Series 1: us abdominal aorta screening aaa · 0.26mm/px · 14 of 25 slices shown]
[im 1/25]
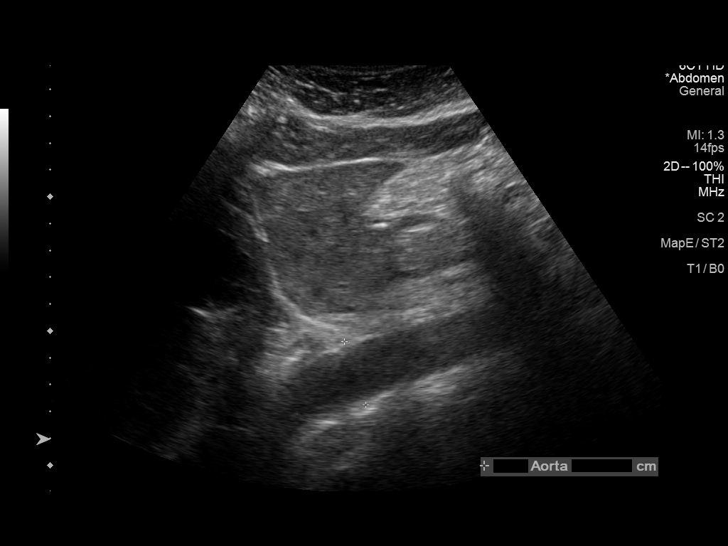
[im 3/25]
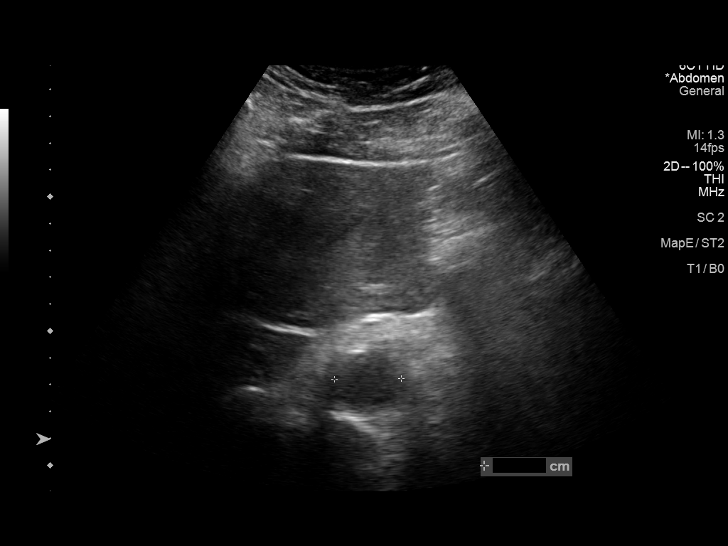
[im 5/25]
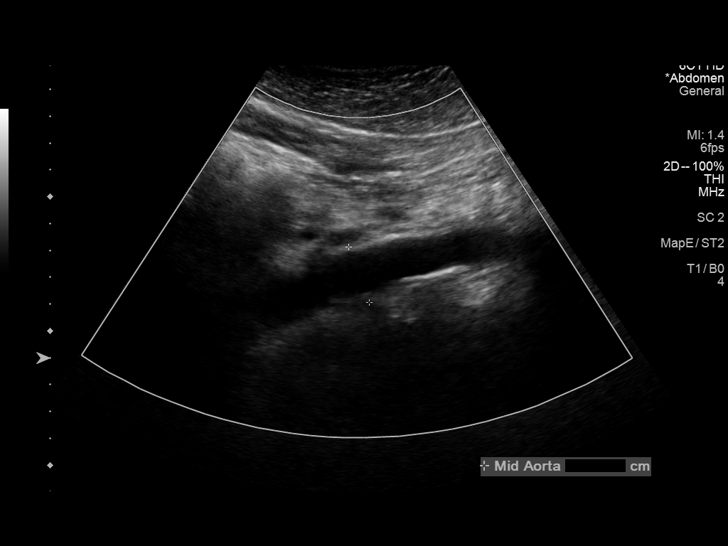
[im 7/25]
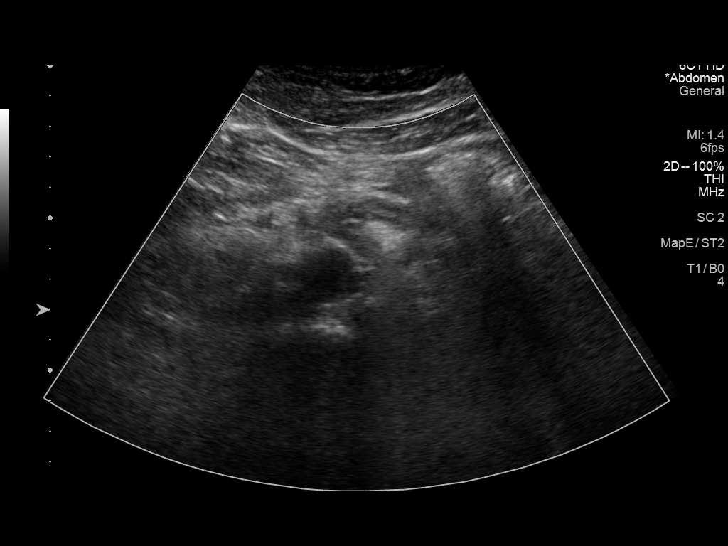
[im 9/25]
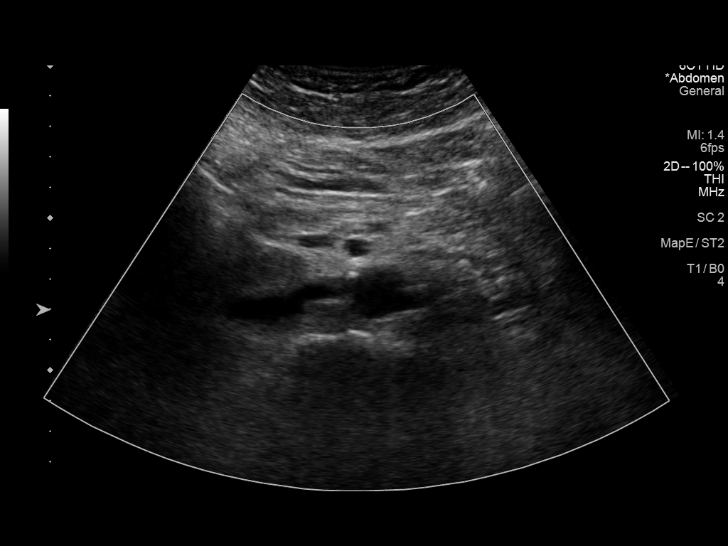
[im 10/25]
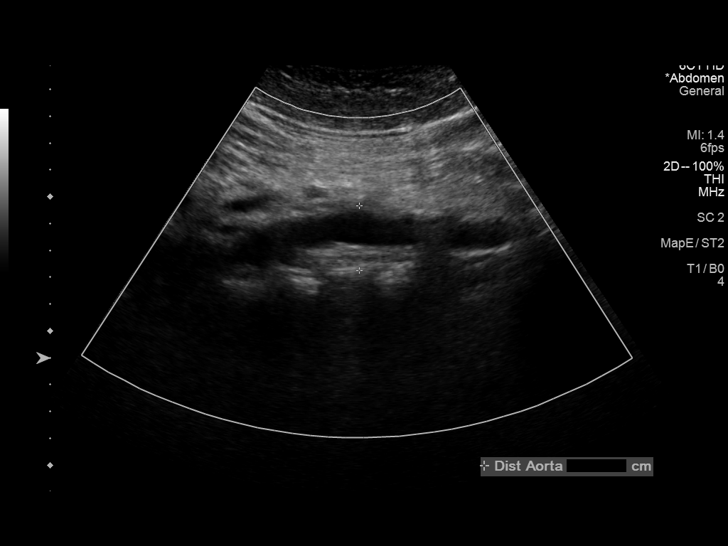
[im 12/25]
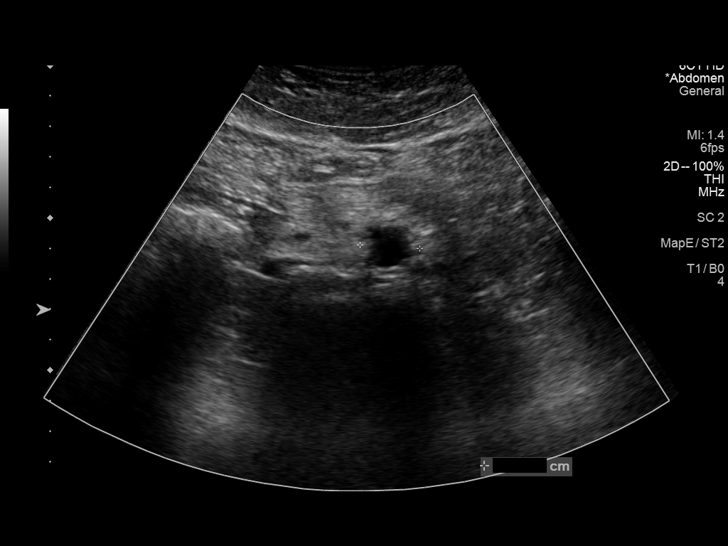
[im 14/25]
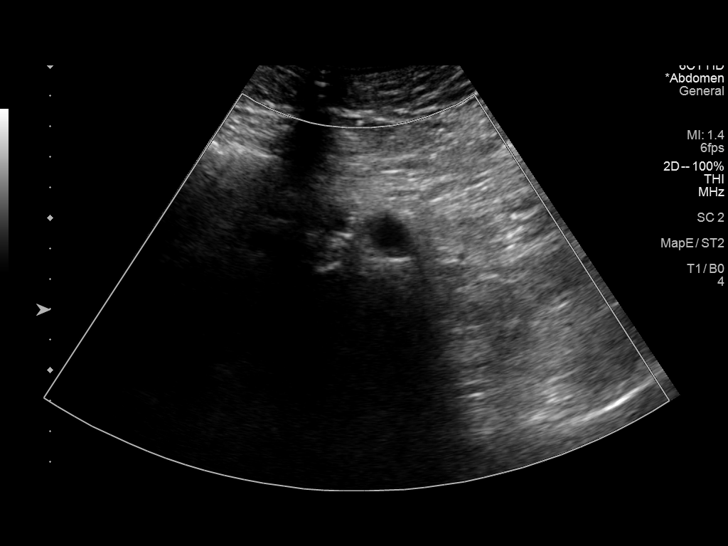
[im 16/25]
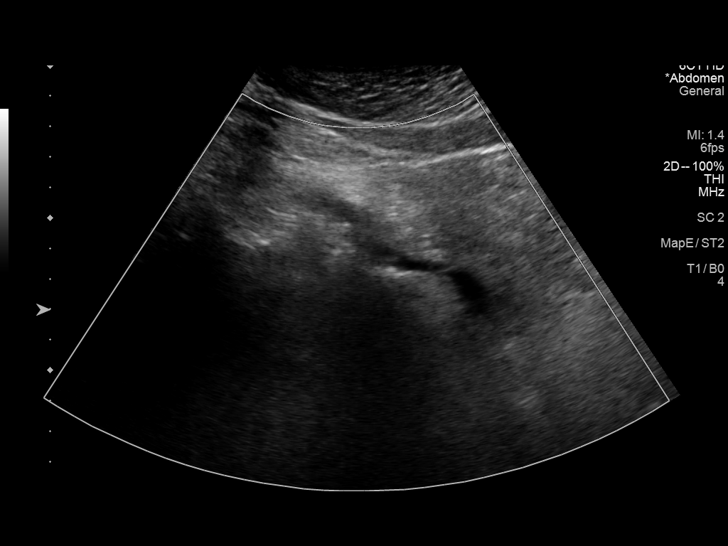
[im 17/25]
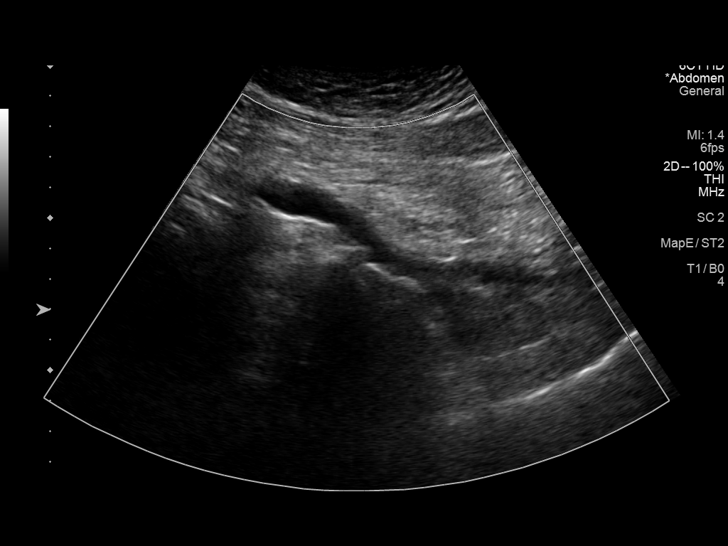
[im 19/25]
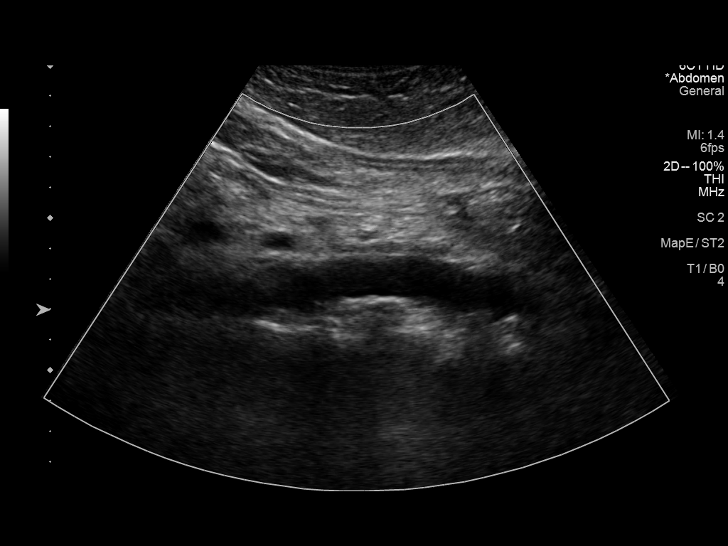
[im 21/25]
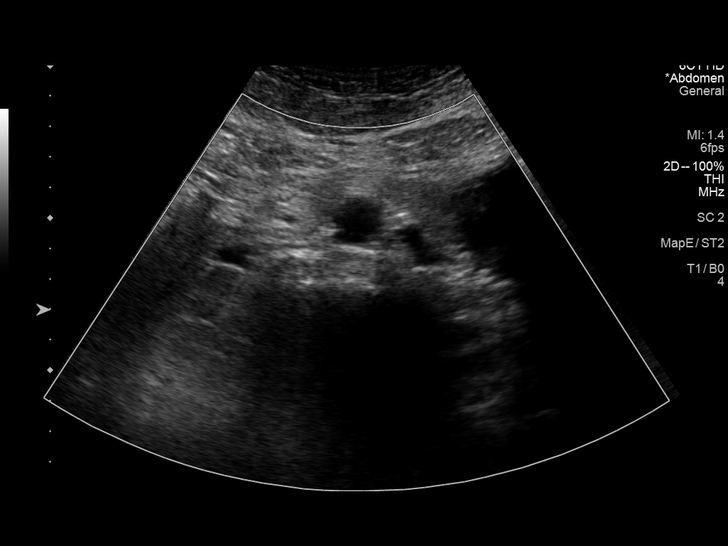
[im 23/25]
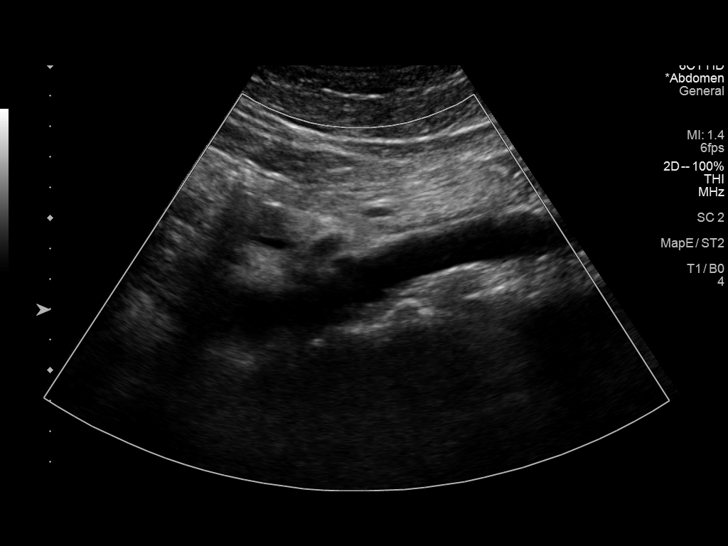
[im 25/25]
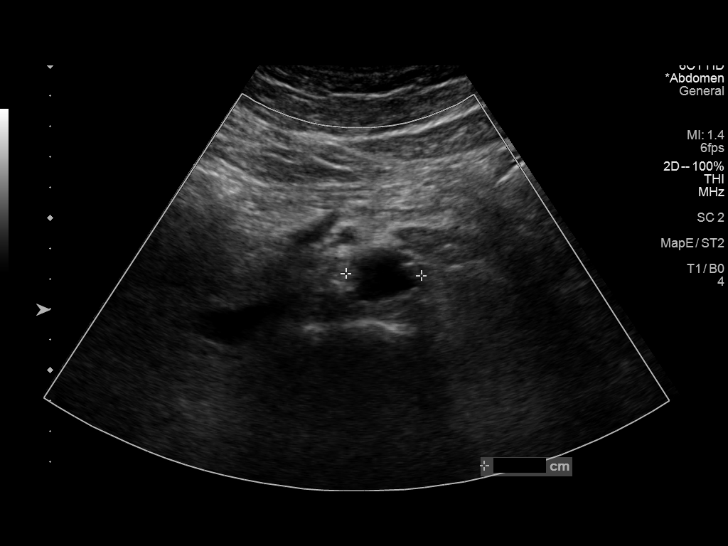

[14 of 25 positions shown; findings below may reference images not displayed]

FINDINGS: Abdominal aortic measurements as follows:

Proximal:  2.5 x 2.5 cm

Mid:  2.2 x 2.5 cm

Distal:  2.4 x 2 cm

Atherosclerotic changes are noted.
IMPRESSION: No evidence for an abdominal aortic aneurysm. No follow-up
recommended. This recommendation follows ACR consensus guidelines:
White Paper of the ACR Incidental Findings Committee II on Vascular
Findings. [HOSPITAL] 7514; [DATE].

## 2021-05-29 HISTORY — PX: COLONOSCOPY: SHX174

## 2021-06-02 DIAGNOSIS — I10 Essential (primary) hypertension: Secondary | ICD-10-CM | POA: Diagnosis not present

## 2021-07-04 DIAGNOSIS — H5203 Hypermetropia, bilateral: Secondary | ICD-10-CM | POA: Diagnosis not present

## 2021-07-04 DIAGNOSIS — H524 Presbyopia: Secondary | ICD-10-CM | POA: Diagnosis not present

## 2021-07-04 DIAGNOSIS — H25813 Combined forms of age-related cataract, bilateral: Secondary | ICD-10-CM | POA: Diagnosis not present

## 2021-07-04 DIAGNOSIS — E1136 Type 2 diabetes mellitus with diabetic cataract: Secondary | ICD-10-CM | POA: Diagnosis not present

## 2021-07-28 ENCOUNTER — Other Ambulatory Visit: Payer: Self-pay

## 2021-07-28 DIAGNOSIS — I6522 Occlusion and stenosis of left carotid artery: Secondary | ICD-10-CM

## 2021-08-02 DIAGNOSIS — Z0000 Encounter for general adult medical examination without abnormal findings: Secondary | ICD-10-CM | POA: Diagnosis not present

## 2021-08-02 DIAGNOSIS — E7800 Pure hypercholesterolemia, unspecified: Secondary | ICD-10-CM | POA: Diagnosis not present

## 2021-08-02 DIAGNOSIS — I6522 Occlusion and stenosis of left carotid artery: Secondary | ICD-10-CM | POA: Diagnosis not present

## 2021-08-02 DIAGNOSIS — Z23 Encounter for immunization: Secondary | ICD-10-CM | POA: Diagnosis not present

## 2021-08-02 DIAGNOSIS — E1169 Type 2 diabetes mellitus with other specified complication: Secondary | ICD-10-CM | POA: Diagnosis not present

## 2021-08-02 DIAGNOSIS — I10 Essential (primary) hypertension: Secondary | ICD-10-CM | POA: Diagnosis not present

## 2021-08-15 ENCOUNTER — Ambulatory Visit (HOSPITAL_COMMUNITY)
Admission: RE | Admit: 2021-08-15 | Discharge: 2021-08-15 | Disposition: A | Discharge status: Home / Self Care | Payer: Medicare Other | Source: Ambulatory Visit | Attending: Surgery | Admitting: Surgery

## 2021-08-15 ENCOUNTER — Other Ambulatory Visit: Payer: Self-pay

## 2021-08-15 ENCOUNTER — Ambulatory Visit: Payer: Medicare Other | Admitting: Physician Assistant

## 2021-08-15 VITALS — BP 115/67 | HR 69 | Temp 97.8°F | Resp 16 | Ht 69.0 in | Wt 167.0 lb

## 2021-08-15 DIAGNOSIS — I6522 Occlusion and stenosis of left carotid artery: Secondary | ICD-10-CM

## 2021-08-15 NOTE — Progress Notes (Signed)
?Office Note  ? ? ? ?CC:  follow up ?Requesting Provider:  Lavone Orn, MD ? ?HPI: Gregg Moon is a 71 y.o. (22-Apr-1951) male who presents for surveillance of carotid artery stenosis.  He underwent left carotid endarterectomy in 2018 by Dr. Trula Slade due to asymptomatic high-grade stenosis of ICA.  He denies any CVA or TIA over the last year since last office visit.  He also denies any current strokelike symptoms including slurring speech, changes in vision, or one-sided weakness.  He is a former smoker.  He is taking an aspirin and statin daily. ? ? ?Past Medical History:  ?Diagnosis Date  ? Anxiety   ? was on meds but has been off since ~ 2010  ? Arthritis   ? "neck" (03/07/2017)  ? Borderline diabetes   ? Carotid artery occlusion   ? Hyperlipidemia   ? takes Zocor daily  ? Hypertension   ? takes Benicar daily  ? Left carotid stenosis   ? Neck pain   ? stenosis  ? Weakness   ? numbness and tingling to the left arm  ? ? ?Past Surgical History:  ?Procedure Laterality Date  ? ANTERIOR CERVICAL CORPECTOMY N/A 07/03/2013  ? Procedure: C/5 Corpectomy w/strut graft + plating;  Surgeon: Eustace Moore, MD;  Location: [place] [facility];  Service: Neurosurgery;  Laterality: N/A;  ? BACK SURGERY    ? CAROTID ENDARTERECTOMY Left 03/07/2017  ? COLONOSCOPY WITH PROPOFOL N/A 03/21/2016  ? Procedure: COLONOSCOPY WITH PROPOFOL;  Surgeon: Garlan Fair, MD;  Location: [facility];  Service: Endoscopy;  Laterality: N/A;  ? ENDARTERECTOMY Left 03/07/2017  ? Procedure: ENDARTERECTOMY CAROTID LEFT;  Surgeon: Serafina Mitchell, MD;  Location: [facility];  Service: Vascular;  Laterality: Left;  ? INGUINAL HERNIA REPAIR Bilateral   ? PATCH ANGIOPLASTY Left 03/07/2017  ? carotid  ? PATCH ANGIOPLASTY Left 03/07/2017  ? Procedure: PATCH ANGIOPLASTY LEFT CAROTID ARTERY;  Surgeon: Serafina Mitchell, MD;  Location: [place];  Service: Vascular;  Laterality: Left;  ? ? ?Social History  ? ?Socioeconomic History  ? Marital status: Married  ?  Spouse name:  Not on file  ? Number of children: Not on file  ? Years of education: Not on file  ? Highest education level: Not on file  ?Occupational History  ? Not on file  ?Tobacco Use  ? Smoking status: Former  ?  Packs/day: 1.50  ?  Years: 20.00  ?  Pack years: 30.00  ?  Types: Cigarettes  ?  Quit date: 03/17/1986  ?  Years since quitting: 35.4  ? Smokeless tobacco: Never  ?Vaping Use  ? Vaping Use: Never used  ?Substance and Sexual Activity  ? Alcohol use: Yes  ?  Alcohol/week: 1.0 standard drink  ?  Types: 1 Cans of beer per week  ? Drug use: No  ? Sexual activity: Yes  ?Other Topics Concern  ? Not on file  ?Social History Narrative  ? Not on file  ? ?Social Determinants of Health  ? ?Financial Resource Strain: Not on file  ?Food Insecurity: Not on file  ?Transportation Needs: Not on file  ?Physical Activity: Not on file  ?Stress: Not on file  ?Social Connections: Not on file  ?Intimate Partner Violence: Not on file  ? ? ?Family History  ?Problem Relation Age of Onset  ? Heart disease Mother   ? Diabetes Mother   ? Heart disease Father   ? Hypertension Sister   ? Diabetes Sister   ? ? ?  Current Outpatient Medications  ?Medication Sig Dispense Refill  ? aspirin EC 81 MG tablet Take 81 mg by mouth daily.    ? losartan-hydrochlorothiazide (HYZAAR) 100-12.5 MG tablet     ? sildenafil (VIAGRA) 100 MG tablet Take 100 mg by mouth daily as needed for erectile dysfunction.    ? simvastatin (ZOCOR) 40 MG tablet Take 40 mg by mouth daily after breakfast.    ? tamsulosin (FLOMAX) 0.4 MG CAPS capsule Take 0.4 mg by mouth daily.    ? ?No current facility-administered medications for this visit.  ? ? ?Allergies  ?Allergen Reactions  ? Naproxen Other (See Comments)  ?  States "makes him act out"  ? Atorvastatin Other (See Comments)  ? Hydrocodone   ?  All pain meds make him "crazy".  ? Other Other (See Comments)  ?  A blood pressure pill but unsure of name-gave bad headache ?A blood pressure pill but unsure of name-gave bad headache  ?  Ramipril Other (See Comments)  ? Sertraline Hcl Other (See Comments)  ? ? ? ?REVIEW OF SYSTEMS:  ? ?'[X]'$  denotes positive finding, '[ ]'$  denotes negative finding ?Cardiac  Comments:  ?Chest pain or chest pressure:    ?Shortness of breath upon exertion:    ?Short of breath when lying flat:    ?Irregular heart rhythm:    ?    ?Vascular    ?Pain in calf, thigh, or hip brought on by ambulation:    ?Pain in feet at night that wakes you up from your sleep:     ?Blood clot in your veins:    ?Leg swelling:     ?    ?Pulmonary    ?Oxygen at home:    ?Productive cough:     ?Wheezing:     ?    ?Neurologic    ?Sudden weakness in arms or legs:     ?Sudden numbness in arms or legs:     ?Sudden onset of difficulty speaking or slurred speech:    ?Temporary loss of vision in one eye:     ?Problems with dizziness:     ?    ?Gastrointestinal    ?Blood in stool:     ?Vomited blood:     ?    ?Genitourinary    ?Burning when urinating:     ?Blood in urine:    ?    ?Psychiatric    ?Major depression:     ?    ?Hematologic    ?Bleeding problems:    ?Problems with blood clotting too easily:    ?    ?Skin    ?Rashes or ulcers:    ?    ?Constitutional    ?Fever or chills:    ? ? ?PHYSICAL EXAMINATION: ? ?Vitals:  ? 08/15/21 0160 08/15/21 1093 08/15/21 0843  ?BP: 129/76 (!) 143/76 115/67  ?Pulse: 69 69 69  ?Resp: 16    ?Temp: 97.8 ?F (36.6 ?C)    ?TempSrc: Temporal    ?SpO2: 98%    ?Weight: 167 lb (75.8 kg)    ?Height: '5\' 9"'$  (1.753 m)    ? ? ?General:  WDWN in NAD; vital signs documented above ?Gait: Not observed ?HENT: WNL, normocephalic ?Pulmonary: normal non-labored breathing , without Rales, rhonchi,  wheezing ?Cardiac: regular HR ?Abdomen: soft, NT, no masses ?Skin: without rashes ?Vascular Exam/Pulses: ? Right Left  ?Radial 2+ (normal) 2+ (normal)  ?DP 1+ (weak) 2+ (normal)  ?PT absent 1+ (weak)  ? ?Extremities: without ischemic  changes, without Gangrene , without cellulitis; without open wounds;  ?Musculoskeletal: no muscle wasting or  atrophy  ?Neurologic: A&O X 3;  CN grossly intact ?Psychiatric:  The pt has Normal affect. ? ? ?Non-Invasive Vascular Imaging:   ?R ICA 1-39% stenosis ?L ICA 1-39% stenosis ? ? ? ?ASSESSMENT/PLAN:: 71 y.o. male here for follow up for surveillance of carotid artery stenosis ? ?-Subjectively patient has not had any neurological symptoms since last office visit ?-Carotid duplex is unchanged.  Left carotid endarterectomy site is widely patent without any hemodynamically significant stenosis.  Right ICA 1 to 39% stenosis ?-Continue aspirin and statin daily ?-Recheck carotid duplex in 1 year per protocol ? ? ?Dagoberto Ligas, PA-C ?[facility] ?[phone] ? ?Clinic MD:   Trula Slade ? ?

## 2021-08-23 DIAGNOSIS — M5441 Lumbago with sciatica, right side: Secondary | ICD-10-CM | POA: Diagnosis not present

## 2021-08-29 ENCOUNTER — Other Ambulatory Visit: Payer: Self-pay | Admitting: Internal Medicine

## 2021-08-29 ENCOUNTER — Ambulatory Visit
Admission: RE | Admit: 2021-08-29 | Discharge: 2021-08-29 | Disposition: A | Discharge status: Home / Self Care | Payer: Medicare Other | Source: Ambulatory Visit | Attending: Internal Medicine | Admitting: Internal Medicine

## 2021-08-29 DIAGNOSIS — M545 Low back pain, unspecified: Secondary | ICD-10-CM | POA: Diagnosis not present

## 2021-08-29 DIAGNOSIS — M5441 Lumbago with sciatica, right side: Secondary | ICD-10-CM | POA: Diagnosis not present

## 2021-09-13 DIAGNOSIS — M5451 Vertebrogenic low back pain: Secondary | ICD-10-CM | POA: Diagnosis not present

## 2021-09-29 DIAGNOSIS — M5416 Radiculopathy, lumbar region: Secondary | ICD-10-CM | POA: Diagnosis not present

## 2021-10-20 DIAGNOSIS — M5451 Vertebrogenic low back pain: Secondary | ICD-10-CM | POA: Diagnosis not present

## 2021-11-01 DIAGNOSIS — R55 Syncope and collapse: Secondary | ICD-10-CM | POA: Diagnosis not present

## 2021-11-04 DIAGNOSIS — R55 Syncope and collapse: Secondary | ICD-10-CM | POA: Diagnosis not present

## 2022-02-03 DIAGNOSIS — R350 Frequency of micturition: Secondary | ICD-10-CM | POA: Diagnosis not present

## 2022-02-07 DIAGNOSIS — I10 Essential (primary) hypertension: Secondary | ICD-10-CM | POA: Diagnosis not present

## 2022-02-07 DIAGNOSIS — Z23 Encounter for immunization: Secondary | ICD-10-CM | POA: Diagnosis not present

## 2022-02-07 DIAGNOSIS — E119 Type 2 diabetes mellitus without complications: Secondary | ICD-10-CM | POA: Diagnosis not present

## 2022-03-23 DIAGNOSIS — R0781 Pleurodynia: Secondary | ICD-10-CM | POA: Diagnosis not present

## 2022-03-24 ENCOUNTER — Ambulatory Visit
Admission: RE | Admit: 2022-03-24 | Discharge: 2022-03-24 | Disposition: A | Discharge status: Home / Self Care | Payer: Medicare Other | Source: Ambulatory Visit | Attending: Internal Medicine | Admitting: Internal Medicine

## 2022-03-24 ENCOUNTER — Other Ambulatory Visit: Payer: Self-pay | Admitting: Internal Medicine

## 2022-03-24 DIAGNOSIS — R0781 Pleurodynia: Secondary | ICD-10-CM

## 2022-04-21 IMAGING — DX DG LUMBAR SPINE 2-3V
3 series · 3 of 3 positions shown · non-contrast
Comparison: None.

CLINICAL DATA: Acute left-sided low back pain, right-sided
sciatica.

EXAM:
LUMBAR SPINE - 2-3 VIEW

[dg lumbar spine 2-3 views (1 of 3)]
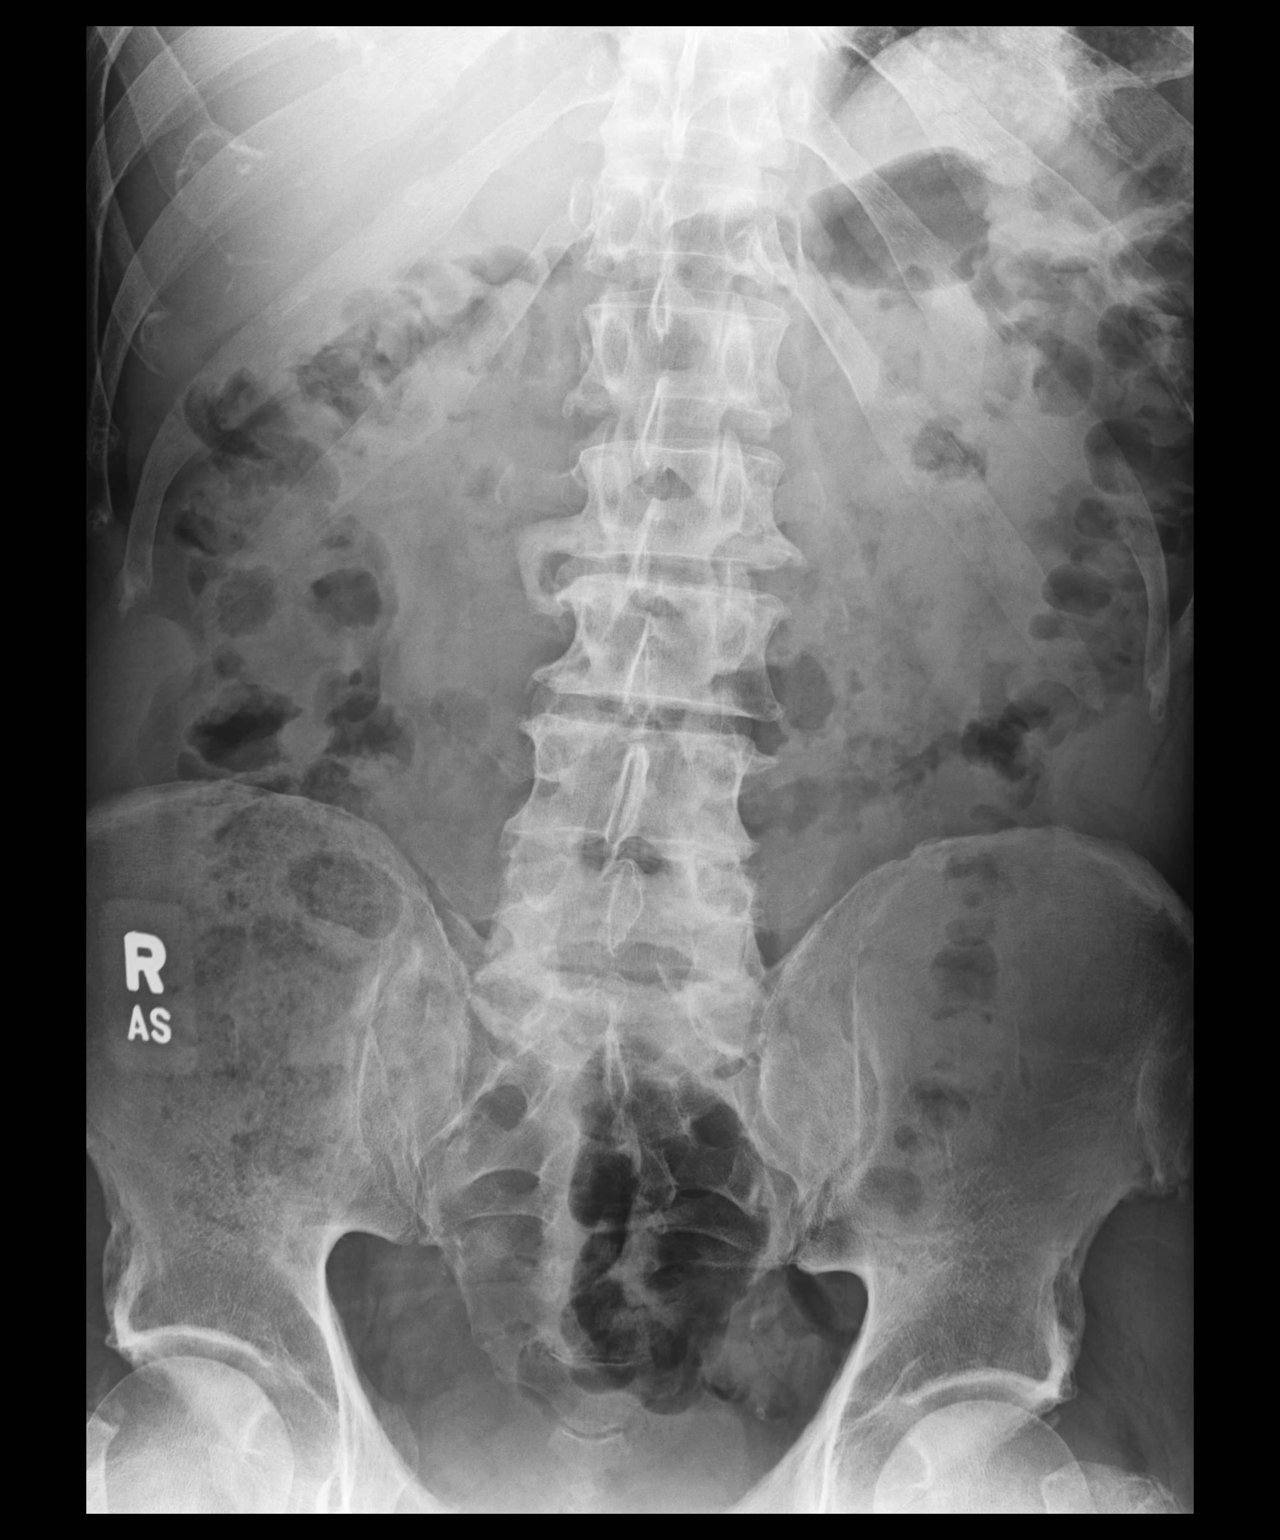

[dg lumbar spine 2-3 views (2 of 3)]
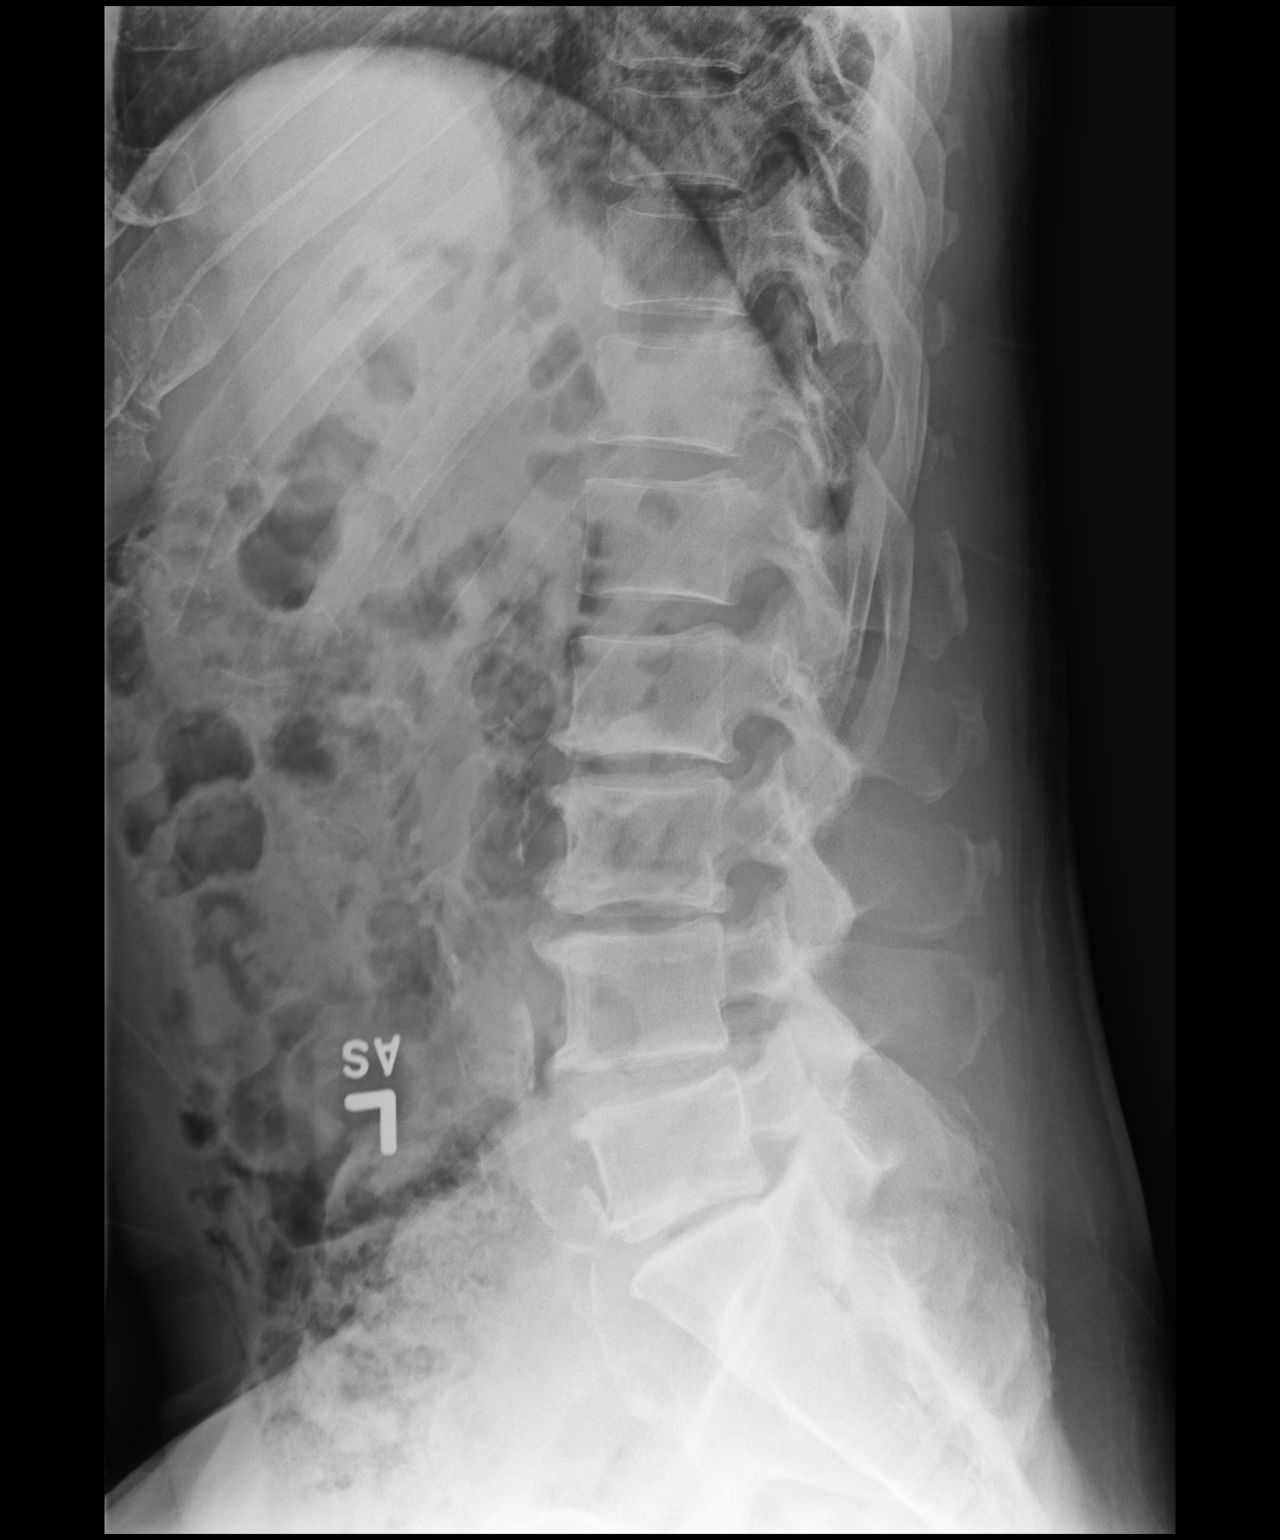

[dg lumbar spine 2-3 views (3 of 3)]
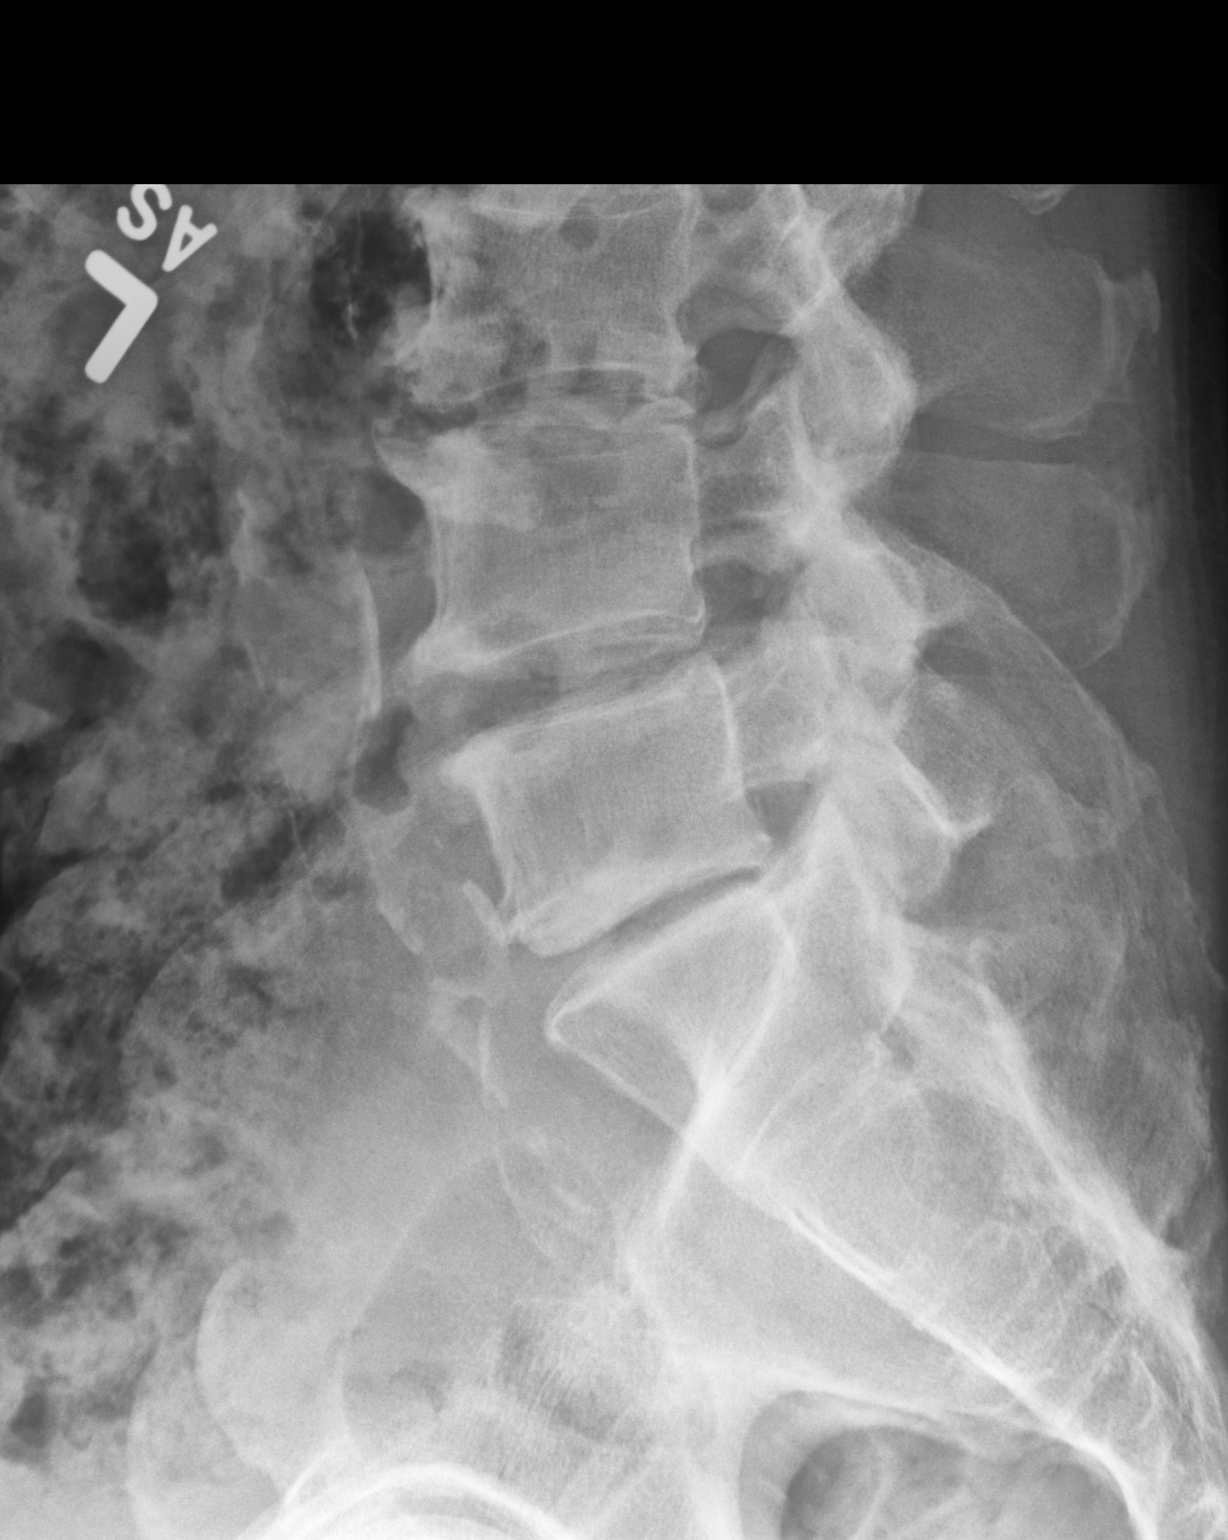

[3 of 3 positions shown; findings below may reference images not displayed]

FINDINGS: Normal lumbar lordosis. No acute fracture or listhesis of the lumbar
spine. Vertebral body height is preserved. Intervertebral disc space
narrowing and endplate remodeling of L2-S1 is seen in keeping with
changes of moderate degenerative disc disease. Paraspinal soft
tissues are unremarkable.
IMPRESSION: Moderate degenerative disc disease L2-S1.

## 2022-05-17 DIAGNOSIS — M5116 Intervertebral disc disorders with radiculopathy, lumbar region: Secondary | ICD-10-CM | POA: Diagnosis not present

## 2022-06-20 DIAGNOSIS — M5416 Radiculopathy, lumbar region: Secondary | ICD-10-CM | POA: Diagnosis not present

## 2022-06-20 DIAGNOSIS — M542 Cervicalgia: Secondary | ICD-10-CM | POA: Diagnosis not present

## 2022-06-27 DIAGNOSIS — R200 Anesthesia of skin: Secondary | ICD-10-CM | POA: Diagnosis not present

## 2022-06-27 DIAGNOSIS — M48061 Spinal stenosis, lumbar region without neurogenic claudication: Secondary | ICD-10-CM | POA: Diagnosis not present

## 2022-06-27 DIAGNOSIS — M5416 Radiculopathy, lumbar region: Secondary | ICD-10-CM | POA: Diagnosis not present

## 2022-06-27 DIAGNOSIS — M542 Cervicalgia: Secondary | ICD-10-CM | POA: Diagnosis not present

## 2022-06-27 DIAGNOSIS — M47812 Spondylosis without myelopathy or radiculopathy, cervical region: Secondary | ICD-10-CM | POA: Diagnosis not present

## 2022-06-27 DIAGNOSIS — M4316 Spondylolisthesis, lumbar region: Secondary | ICD-10-CM | POA: Diagnosis not present

## 2022-10-18 ENCOUNTER — Other Ambulatory Visit: Payer: Self-pay | Admitting: Neurological Surgery

## 2022-11-13 NOTE — Pre-Procedure Instructions (Signed)
Surgical Instructions    Your procedure is scheduled on November 24, 2022.  Report to [facility] Main Entrance "A" at 7:45 A.M., then check in with the Admitting office.  Call this number if you have problems the morning of surgery:  [phone]  If you have any questions prior to your surgery date call [phone]: Open Monday-Friday 8am-4pm If you experience any cold or flu symptoms such as cough, fever, chills, shortness of breath, etc. between now and your scheduled surgery, please notify us at the above number.     Remember:  Do not eat or drink after midnight the night before your surgery     Take these medicines the morning of surgery with A SIP OF WATER:  famotidine (PEPCID)   tamsulosin (FLOMAX)    Follow your surgeon's instructions on when to stop Aspirin.  If no instructions were given by your surgeon then you will need to call the office to get those instructions.     As of today, STOP taking any Aleve, Naproxen, Ibuprofen, Motrin, Advil, Goody's, BC's, all herbal medications, fish oil, and all vitamins.   WHAT DO I DO ABOUT MY DIABETES MEDICATION?   Do not take metFORMIN (GLUCOPHAGE) the morning of surgery.   HOW TO MANAGE YOUR DIABETES BEFORE AND AFTER SURGERY  Why is it important to control my blood sugar before and after surgery? Improving blood sugar levels before and after surgery helps healing and can limit problems. A way of improving blood sugar control is eating a healthy diet by:  Eating less sugar and carbohydrates  Increasing activity/exercise  Talking with your doctor about reaching your blood sugar goals High blood sugars (greater than 180 mg/dL) can raise your risk of infections and slow your recovery, so you will need to focus on controlling your diabetes during the weeks before surgery. Make sure that the doctor who takes care of your diabetes knows about your planned surgery including the date and location.  How do I manage my blood sugar  before surgery? Check your blood sugar at least 4 times a day, starting 2 days before surgery, to make sure that the level is not too high or low.  Check your blood sugar the morning of your surgery when you wake up and every 2 hours until you get to the Short Stay unit.  If your blood sugar is less than 70 mg/dL, you will need to treat for low blood sugar: Do not take insulin. Treat a low blood sugar (less than 70 mg/dL) with  cup of clear juice (cranberry or apple), 4 glucose tablets, OR glucose gel. Recheck blood sugar in 15 minutes after treatment (to make sure it is greater than 70 mg/dL). If your blood sugar is not greater than 70 mg/dL on recheck, call [phone] for further instructions. Report your blood sugar to the short stay nurse when you get to Short Stay.  If you are admitted to the hospital after surgery: Your blood sugar will be checked by the staff and you will probably be given insulin after surgery (instead of oral diabetes medicines) to make sure you have good blood sugar levels. The goal for blood sugar control after surgery is 80-180 mg/dL.                      Do NOT Smoke (Tobacco/Vaping) for 24 hours prior to your procedure.  If you use a CPAP at night, you may bring your mask/headgear for your overnight stay.  Contacts, glasses, piercing's, hearing aid's, dentures or partials may not be worn into surgery, please bring cases for these belongings.    For patients admitted to the hospital, discharge time will be determined by your treatment team.   Patients discharged the day of surgery will not be allowed to drive home, and someone needs to stay with them for 24 hours.  SURGICAL WAITING ROOM VISITATION Patients having surgery or a procedure may have no more than 2 support people in the waiting area - these visitors may rotate.   Children under the age of 67 must have an adult with them who is not the patient. If the patient needs to stay at the hospital  during part of their recovery, the visitor guidelines for inpatient rooms apply. Pre-op nurse will coordinate an appropriate time for 1 support person to accompany patient in pre-op.  This support person may not rotate.   Please refer to the [facility] website for the visitor guidelines for Inpatients (after your surgery is over and you are in a regular room).   If you received a COVID test during your pre-op visit  it is requested that you wear a mask when out in public, stay away from anyone that may not be feeling well and notify your surgeon if you develop symptoms. If you have been in contact with anyone that has tested positive in the last 10 days please notify you surgeon.    Pre-operative 5 CHG Bath Instructions   You can play a key role in reducing the risk of infection after surgery. Your skin needs to be as free of germs as possible. You can reduce the number of germs on your skin by washing with CHG (chlorhexidine gluconate) soap before surgery. CHG is an antiseptic soap that kills germs and continues to kill germs even after washing.   DO NOT use if you have an allergy to chlorhexidine/CHG or antibacterial soaps. If your skin becomes reddened or irritated, stop using the CHG and notify one of our RNs at [phone].   Please shower with the CHG soap starting 4 days before surgery using the following schedule:     Please keep in mind the following:  DO NOT shave, including legs and underarms, starting the day of your first shower.   You may shave your face at any point before/day of surgery.  Place clean sheets on your bed the day you start using CHG soap. Use a clean washcloth (not used since being washed) for each shower. DO NOT sleep with pets once you start using the CHG.   CHG Shower Instructions:  If you choose to wash your hair and private area, wash first with your normal shampoo/soap.  After you use shampoo/soap, rinse your hair and body thoroughly to remove  shampoo/soap residue.  Turn the water OFF and apply about 3 tablespoons (45 ml) of CHG soap to a CLEAN washcloth.  Apply CHG soap ONLY FROM YOUR NECK DOWN TO YOUR TOES (washing for 3-5 minutes)  DO NOT use CHG soap on face, private areas, open wounds, or sores.  Pay special attention to the area where your surgery is being performed.  If you are having back surgery, having someone wash your back for you may be helpful. Wait 2 minutes after CHG soap is applied, then you may rinse off the CHG soap.  Pat dry with a clean towel  Put on clean clothes/pajamas   If you choose to wear lotion, please use ONLY the CHG-compatible lotions  on the back of this paper.     Additional instructions for the day of surgery: DO NOT APPLY any lotions, deodorants, cologne, or perfumes.   Do not wear jewelry or makeup Do not wear nail polish, gel polish, artificial nails, or any other type of covering on natural nails (fingers and toes) Do not bring valuables to the hospital. [facility] is not responsible for any belongings or valuables. Put on clean/comfortable clothes.  Brush your teeth.  Ask your nurse before applying any prescription medications to the skin.      CHG Compatible Lotions   Aveeno Moisturizing lotion  Cetaphil Moisturizing Cream  Cetaphil Moisturizing Lotion  Clairol Herbal Essence Moisturizing Lotion, Dry Skin  Clairol Herbal Essence Moisturizing Lotion, Extra Dry Skin  Clairol Herbal Essence Moisturizing Lotion, Normal Skin  Curel Age Defying Therapeutic Moisturizing Lotion with Alpha Hydroxy  Curel Extreme Care Body Lotion  Curel Soothing Hands Moisturizing Hand Lotion  Curel Therapeutic Moisturizing Cream, Fragrance-Free  Curel Therapeutic Moisturizing Lotion, Fragrance-Free  Curel Therapeutic Moisturizing Lotion, Original Formula  Eucerin Daily Replenishing Lotion  Eucerin Dry Skin Therapy Plus Alpha Hydroxy Crme  Eucerin Dry Skin Therapy Plus Alpha Hydroxy Lotion  Eucerin  Original Crme  Eucerin Original Lotion  Eucerin Plus Crme Eucerin Plus Lotion  Eucerin TriLipid Replenishing Lotion  Keri Anti-Bacterial Hand Lotion  Keri Deep Conditioning Original Lotion Dry Skin Formula Softly Scented  Keri Deep Conditioning Original Lotion, Fragrance Free Sensitive Skin Formula  Keri Lotion Fast Absorbing Fragrance Free Sensitive Skin Formula  Keri Lotion Fast Absorbing Softly Scented Dry Skin Formula  Keri Original Lotion  Keri Skin Renewal Lotion Keri Silky Smooth Lotion  Keri Silky Smooth Sensitive Skin Lotion  Nivea Body Creamy Conditioning Oil  Nivea Body Extra Enriched Lotion  Nivea Body Original Lotion  Nivea Body Sheer Moisturizing Lotion Nivea Crme  Nivea Skin Firming Lotion  NutraDerm 30 Skin Lotion  NutraDerm Skin Lotion  NutraDerm Therapeutic Skin Cream  NutraDerm Therapeutic Skin Lotion  ProShield Protective Hand Cream  Provon moisturizing lotion   Please read over the following fact sheets that you were given.

## 2022-11-14 ENCOUNTER — Encounter (HOSPITAL_COMMUNITY): Payer: Self-pay

## 2022-11-14 ENCOUNTER — Other Ambulatory Visit: Payer: Self-pay

## 2022-11-14 ENCOUNTER — Encounter (HOSPITAL_COMMUNITY)
Admission: RE | Admit: 2022-11-14 | Discharge: 2022-11-14 | Disposition: A | Discharge status: Home / Self Care | Payer: Medicare Other | Source: Ambulatory Visit | Attending: Neurological Surgery | Admitting: Neurological Surgery

## 2022-11-14 VITALS — BP 149/83 | HR 73 | Temp 98.3°F | Resp 18 | Ht 69.0 in | Wt 172.4 lb

## 2022-11-14 DIAGNOSIS — E119 Type 2 diabetes mellitus without complications: Secondary | ICD-10-CM

## 2022-11-14 DIAGNOSIS — Z01818 Encounter for other preprocedural examination: Secondary | ICD-10-CM | POA: Insufficient documentation

## 2022-11-14 DIAGNOSIS — Z79899 Other long term (current) drug therapy: Secondary | ICD-10-CM

## 2022-11-14 DIAGNOSIS — I2510 Atherosclerotic heart disease of native coronary artery without angina pectoris: Secondary | ICD-10-CM | POA: Diagnosis not present

## 2022-11-14 HISTORY — DX: Peripheral vascular disease, unspecified: I73.9

## 2022-11-14 HISTORY — DX: Type 2 diabetes mellitus without complications: E11.9

## 2022-11-14 LAB — HEMOGLOBIN A1C
Hgb A1c MFr Bld: 6.3 % — ABNORMAL HIGH (ref 4.8–5.6)
Mean Plasma Glucose: 134.11 mg/dL

## 2022-11-14 LAB — CBC
HCT: 39.5 % (ref 39.0–52.0)
Hemoglobin: 13.4 g/dL (ref 13.0–17.0)
MCH: 29.8 pg (ref 26.0–34.0)
MCHC: 33.9 g/dL (ref 30.0–36.0)
MCV: 88 fL (ref 80.0–100.0)
Platelets: 187 10*3/uL (ref 150–400)
RBC: 4.49 MIL/uL (ref 4.22–5.81)
RDW: 13 % (ref 11.5–15.5)
WBC: 3.7 10*3/uL — ABNORMAL LOW (ref 4.0–10.5)
nRBC: 0 % (ref 0.0–0.2)

## 2022-11-14 LAB — BASIC METABOLIC PANEL
Anion gap: 11 (ref 5–15)
BUN: 14 mg/dL (ref 8–23)
CO2: 21 mmol/L — ABNORMAL LOW (ref 22–32)
Calcium: 9.5 mg/dL (ref 8.9–10.3)
Chloride: 105 mmol/L (ref 98–111)
Creatinine, Ser: 0.77 mg/dL (ref 0.61–1.24)
GFR, Estimated: >60 mL/min (ref >60)
Glucose, Bld: 116 mg/dL — ABNORMAL HIGH (ref 70–99)
Potassium: 3.9 mmol/L (ref 3.5–5.1)
Sodium: 137 mmol/L (ref 135–145)

## 2022-11-14 LAB — SURGICAL PCR SCREEN
MRSA, PCR: NEGATIVE
Staphylococcus aureus: NEGATIVE

## 2022-11-14 LAB — GLUCOSE, CAPILLARY: Glucose-Capillary: 121 mg/dL — ABNORMAL HIGH (ref 70–99)

## 2022-11-14 LAB — PROTIME-INR
INR: 0.9 (ref 0.8–1.2)
Prothrombin Time: 12.5 seconds (ref 11.4–15.2)

## 2022-11-14 NOTE — Progress Notes (Signed)
PCP - Dr. Corliss Blacker Cardiologist - Dr. Eldridge Dace (Saw in 2018 per Dr. Myra Gianotti for pre-op clearance for Left Carotid Endarterectomy. PRN follow-up)  PPM/ICD - Denies Device Orders - n/a Rep Notified - n/a  Chest x-ray - 03/24/2022 EKG - 11/14/2022 Stress Test - Per pt, completed many years ago - result normal ECHO - 09/29/2009 Cardiac Cath - Denies  Sleep Study - Denies CPAP - n/a  Pt is a DM2. He does not own a blood sugar meter. RN strongly encouraged pt to get one. CBG at pre-op appointment 121. Pt has only had water today. A1c result pending.  Last dose of GLP1 agonist- n/a GLP1 instructions: n/a  Blood Thinner Instructions: n/a Aspirin Instructions: Pt instructed to hold ASA for 6 days. His last dose will be Friday, June 21st.  NPO after midnight   COVID TEST- n/a   Anesthesia review: Yes. Abnormal EKG review.   Patient denies shortness of breath, fever, cough and chest pain at PAT appointment. Pt denies any respiratory illness/infection the last two months.   All instructions explained to the patient, with a verbal understanding of the material. Patient agrees to go over the instructions while at home for a better understanding. Patient also instructed to self quarantine after being tested for COVID-19. The opportunity to ask questions was provided.

## 2022-11-15 NOTE — Anesthesia Preprocedure Evaluation (Addendum)
Anesthesia Evaluation  Patient identified by MRN, date of birth, ID band Patient awake    Reviewed: Allergy & Precautions, NPO status , Patient's Chart, lab work & pertinent test results  Airway Mallampati: II  TM Distance: >3 FB Neck ROM: Limited    Dental  (+) Teeth Intact, Dental Advisory Given   Pulmonary former smoker   breath sounds clear to auscultation       Cardiovascular hypertension, + Peripheral Vascular Disease   Rhythm:Regular Rate:Normal     Neuro/Psych   Anxiety     negative neurological ROS     GI/Hepatic negative GI ROS, Neg liver ROS,,,  Endo/Other  diabetes    Renal/GU negative Renal ROS     Musculoskeletal  (+) Arthritis ,    Abdominal   Peds  Hematology negative hematology ROS (+)   Anesthesia Other Findings - HLD  Reproductive/Obstetrics                             Anesthesia Physical Anesthesia Plan  ASA: 3  Anesthesia Plan: General   Post-op Pain Management: Tylenol PO (pre-op)*   Induction: Intravenous  PONV Risk Score and Plan: 3 and Ondansetron and Treatment may vary due to age or medical condition  Airway Management Planned: Oral ETT and Video Laryngoscope Planned  Additional Equipment: None  Intra-op Plan:   Post-operative Plan: Extubation in OR  Informed Consent: I have reviewed the patients History and Physical, chart, labs and discussed the procedure including the risks, benefits and alternatives for the proposed anesthesia with the patient or authorized representative who has indicated his/her understanding and acceptance.     Dental advisory given  Plan Discussed with: CRNA  Anesthesia Plan Comments: (PAT note written 11/15/2022 by Shonna Chock, PA-C.  )       Anesthesia Quick Evaluation

## 2022-11-15 NOTE — Progress Notes (Signed)
Anesthesia Chart Review:  Case: [id number] Date/Time: 11/24/22 0937   Procedure: ACDF - C3-C4, remove plate G2-9 - 3C   Anesthesia type: General   Pre-op diagnosis: Stenosis   [facility] ROOM 20 / [facility] OR   Surgeons: Arman Bogus, MD       DISCUSSION: Patient is a 72 year old male scheduled for the above procedure.  History includes former smoker (quit 03/17/86), HTN, HLD, carotid artery stenosis (s/p left carotid enterectomy 03/07/17), DM2, spinal surgery (C4-6 ACDF 07/03/13; back surgery).  Last ASA planned for 11/17/22. EKG showed NSR, non-specific ST/T abnormality. He denied chest pain, SOB at PAT RN visit. A1c 6.3%.   Anesthesia team to evaluate on the day of surgery.    VS: BP (!) 149/83   Pulse 73   Temp 36.8 C (Oral)   Resp 18   Ht 5\' 9"  (1.753 m)   Wt 78.2 kg   SpO2 99%   BMI 25.46 kg/m    PROVIDERS: Georgann Housekeeper, MD is PCP   Myra Gianotti, V. Anner Crete, MD is vascular surgeon. Last APP visit 08/15/21. US showed 1-39% BICA.   - He is not routinely followed by cardiology. He had a normal stress echo in 2011 at the former [facility]. He was evaluated on 02/22/17 by Robbie Lis, PA-C/[place], Renelda Loma, MD for preoperative evaluation prior to carotid endarterectomy. He was asymptomatic at that time. No additional cardiac testing ordered. As needed follow-up recommended.    LABS: Labs reviewed: Acceptable for surgery. (all labs ordered are listed, but only abnormal results are displayed)  Labs Reviewed  GLUCOSE, CAPILLARY - Abnormal; Notable for the following components:      Result Value   Glucose-Capillary 121 (*)    All other components within normal limits  HEMOGLOBIN A1C - Abnormal; Notable for the following components:   Hgb A1c MFr Bld 6.3 (*)    All other components within normal limits  BASIC METABOLIC PANEL - Abnormal; Notable for the following components:   CO2 21 (*)    Glucose, Bld 116 (*)    All other components within normal limits   CBC - Abnormal; Notable for the following components:   WBC 3.7 (*)    All other components within normal limits  SURGICAL PCR SCREEN  PROTIME-INR    IMAGES: CXR 03/24/22: FINDINGS: The heart size and mediastinal contours are within normal limits. Both lungs are clear. Cervical spinal fusion plate is present. No acute fractures are seen. IMPRESSION: No active cardiopulmonary disease.   EKG: 11/14/22: Normal sinus rhythm Nonspecific ST and T wave abnormality Abnormal ECG When compared with ECG of 01-Jul-2013 17:22, there is mild inferolateral T wave flattening Confirmed by Croitoru, Mihai [phone]) on 11/14/2022 5:02:30 PM   CV: US Carotid 08/15/21: Summary:  - Right Carotid: Velocities in the right ICA are consistent with a 1-39%  stenosis.  - Left Carotid: Velocities in the left ICA are consistent with a 1-39%  stenosis. Patent CEA  - Vertebrals:  Bilateral vertebral arteries demonstrate antegrade flow.  - Subclavians: Normal flow hemodynamics were seen in bilateral subclavian arteries.   Holter monitor 08/02/16: Rare PVCs/PACs (77/25/) No atrial fibrillation, no adverse arrhythmias No significant pauses Reassuring monitor Donato Schultz, MD  Stress Echo 09/29/09: Impression: Normal LV systolic function.  Mild LVH. Mild aortic sclerosis/calcification. Trace mitral regurgitation. Normal stress echo.  No evidence of ischemia.  Past Medical History:  Diagnosis Date   Anxiety    was on meds but has been off since ~ 2010  Arthritis    "neck" (03/07/2017)   Carotid artery occlusion    Diabetes mellitus without complication (HCC)    Type 2   Hyperlipidemia    takes Zocor daily   Hypertension    takes Benicar daily   Left carotid stenosis    Neck pain    stenosis   Peripheral vascular disease (HCC)    Carotid Endarterectomy 2018   Weakness    numbness and tingling to the left arm    Past Surgical History:  Procedure Laterality Date   ANTERIOR CERVICAL  CORPECTOMY N/A 07/03/2013   Procedure: C/5 Corpectomy w/strut graft + plating;  Surgeon: Tia Alert, MD;  Location: [facility]URO ORS;  Service: Neurosurgery;  Laterality: N/A;   BACK SURGERY     CAROTID ENDARTERECTOMY Left 03/07/2017   COLONOSCOPY  2023   COLONOSCOPY WITH PROPOFOL N/A 03/21/2016   Procedure: COLONOSCOPY WITH PROPOFOL;  Surgeon: Charolett Bumpers, MD;  Location: [facility] ENDOSCOPY;  Service: Endoscopy;  Laterality: N/A;   ENDARTERECTOMY Left 03/07/2017   Procedure: ENDARTERECTOMY CAROTID LEFT;  Surgeon: Nada Libman, MD;  Location: [facility] OR;  Service: Vascular;  Laterality: Left;   INGUINAL HERNIA REPAIR Bilateral    PATCH ANGIOPLASTY Left 03/07/2017   carotid   PATCH ANGIOPLASTY Left 03/07/2017   Procedure: PATCH ANGIOPLASTY LEFT CAROTID ARTERY;  Surgeon: Nada Libman, MD;  Location: [facility] OR;  Service: Vascular;  Laterality: Left;    MEDICATIONS:  aspirin EC 81 MG tablet   famotidine (PEPCID) 40 MG tablet   losartan-hydrochlorothiazide (HYZAAR) 100-12.5 MG tablet   metFORMIN (GLUCOPHAGE) 1000 MG tablet   rosuvastatin (CRESTOR) 10 MG tablet   sildenafil (VIAGRA) 100 MG tablet   tamsulosin (FLOMAX) 0.4 MG CAPS capsule   No current facility-administered medications for this encounter.    Shonna Chock, PA-C Surgical Short Stay/Anesthesiology [facility] Phone [phone] [facility] Phone [phone] 11/15/2022 2:41 PM

## 2022-11-24 ENCOUNTER — Inpatient Hospital Stay (HOSPITAL_COMMUNITY): Payer: Medicare Other | Admitting: Vascular Surgery

## 2022-11-24 ENCOUNTER — Encounter (HOSPITAL_COMMUNITY)
Admission: RE | Disposition: A | Discharge status: Home / Self Care | Payer: Self-pay | Source: Home / Self Care | Attending: Neurological Surgery

## 2022-11-24 ENCOUNTER — Inpatient Hospital Stay (HOSPITAL_COMMUNITY): Payer: Medicare Other | Admitting: Critical Care Medicine

## 2022-11-24 ENCOUNTER — Inpatient Hospital Stay (HOSPITAL_COMMUNITY)
Admission: RE | Admit: 2022-11-24 | Discharge: 2022-11-25 | DRG: 473 | Disposition: A | Discharge status: Home / Self Care | Payer: Medicare Other | Attending: Neurological Surgery | Admitting: Neurological Surgery

## 2022-11-24 ENCOUNTER — Encounter (HOSPITAL_COMMUNITY): Payer: Self-pay | Admitting: Neurological Surgery

## 2022-11-24 ENCOUNTER — Inpatient Hospital Stay (HOSPITAL_COMMUNITY): Payer: Medicare Other

## 2022-11-24 ENCOUNTER — Other Ambulatory Visit: Payer: Self-pay

## 2022-11-24 DIAGNOSIS — Z87891 Personal history of nicotine dependence: Secondary | ICD-10-CM

## 2022-11-24 DIAGNOSIS — M4722 Other spondylosis with radiculopathy, cervical region: Secondary | ICD-10-CM

## 2022-11-24 DIAGNOSIS — E785 Hyperlipidemia, unspecified: Secondary | ICD-10-CM | POA: Diagnosis present

## 2022-11-24 DIAGNOSIS — Z833 Family history of diabetes mellitus: Secondary | ICD-10-CM

## 2022-11-24 DIAGNOSIS — Z79899 Other long term (current) drug therapy: Secondary | ICD-10-CM | POA: Diagnosis not present

## 2022-11-24 DIAGNOSIS — E1151 Type 2 diabetes mellitus with diabetic peripheral angiopathy without gangrene: Secondary | ICD-10-CM | POA: Diagnosis present

## 2022-11-24 DIAGNOSIS — Z7982 Long term (current) use of aspirin: Secondary | ICD-10-CM | POA: Diagnosis not present

## 2022-11-24 DIAGNOSIS — M5011 Cervical disc disorder with radiculopathy,  high cervical region: Secondary | ICD-10-CM | POA: Diagnosis present

## 2022-11-24 DIAGNOSIS — I6522 Occlusion and stenosis of left carotid artery: Secondary | ICD-10-CM | POA: Diagnosis present

## 2022-11-24 DIAGNOSIS — M2578 Osteophyte, vertebrae: Secondary | ICD-10-CM | POA: Diagnosis present

## 2022-11-24 DIAGNOSIS — Z886 Allergy status to analgesic agent status: Secondary | ICD-10-CM | POA: Diagnosis not present

## 2022-11-24 DIAGNOSIS — Z7984 Long term (current) use of oral hypoglycemic drugs: Secondary | ICD-10-CM | POA: Diagnosis not present

## 2022-11-24 DIAGNOSIS — I10 Essential (primary) hypertension: Secondary | ICD-10-CM | POA: Diagnosis present

## 2022-11-24 DIAGNOSIS — Z885 Allergy status to narcotic agent status: Secondary | ICD-10-CM | POA: Diagnosis not present

## 2022-11-24 DIAGNOSIS — Z8249 Family history of ischemic heart disease and other diseases of the circulatory system: Secondary | ICD-10-CM | POA: Diagnosis not present

## 2022-11-24 DIAGNOSIS — M5001 Cervical disc disorder with myelopathy,  high cervical region: Principal | ICD-10-CM | POA: Diagnosis present

## 2022-11-24 DIAGNOSIS — M4802 Spinal stenosis, cervical region: Principal | ICD-10-CM | POA: Diagnosis present

## 2022-11-24 DIAGNOSIS — E119 Type 2 diabetes mellitus without complications: Secondary | ICD-10-CM

## 2022-11-24 DIAGNOSIS — I739 Peripheral vascular disease, unspecified: Secondary | ICD-10-CM

## 2022-11-24 DIAGNOSIS — Z888 Allergy status to other drugs, medicaments and biological substances status: Secondary | ICD-10-CM

## 2022-11-24 DIAGNOSIS — Z981 Arthrodesis status: Principal | ICD-10-CM

## 2022-11-24 HISTORY — PX: ANTERIOR CERVICAL DECOMP/DISCECTOMY FUSION: SHX1161

## 2022-11-24 LAB — GLUCOSE, CAPILLARY
Glucose-Capillary: 165 mg/dL — ABNORMAL HIGH (ref 70–99)
Glucose-Capillary: 131 mg/dL — ABNORMAL HIGH (ref 70–99)
Glucose-Capillary: 143 mg/dL — ABNORMAL HIGH (ref 70–99)
Glucose-Capillary: 125 mg/dL — ABNORMAL HIGH (ref 70–99)
Glucose-Capillary: 126 mg/dL — ABNORMAL HIGH (ref 70–99)
Glucose-Capillary: 197 mg/dL — ABNORMAL HIGH (ref 70–99)

## 2022-11-24 SURGERY — ANTERIOR CERVICAL DECOMPRESSION/DISCECTOMY FUSION 1 LEVEL/HARDWARE REMOVAL
Anesthesia: General | Site: Spine Cervical

## 2022-11-24 MED ORDER — OXYCODONE HCL 5 MG/5ML PO SOLN: 5.0000 mg | Freq: Once | ORAL | Status: DC | PRN

## 2022-11-24 MED ORDER — ACETAMINOPHEN 500 MG PO TABS: 1000.0000 mg | ORAL_TABLET | ORAL | Status: DC

## 2022-11-24 MED ORDER — FENTANYL CITRATE (PF) 250 MCG/5ML IJ SOLN
INTRAMUSCULAR | Status: DC | PRN
  Administered 2022-11-24: 50 ug via INTRAVENOUS
  Administered 2022-11-24: 100 ug via INTRAVENOUS
  Administered 2022-11-24: 50 ug via INTRAVENOUS

## 2022-11-24 MED ORDER — PHENOL 1.4 % MT LIQD: 1.0000 | OROMUCOSAL | Status: DC | PRN

## 2022-11-24 MED ORDER — PROPOFOL 10 MG/ML IV BOLUS
INTRAVENOUS | Status: DC | PRN
  Administered 2022-11-24: 130 mg via INTRAVENOUS

## 2022-11-24 MED ORDER — DEXAMETHASONE SODIUM PHOSPHATE 4 MG/ML IJ SOLN
4.0000 mg | Freq: Four times a day (QID) | INTRAMUSCULAR | Status: DC
  Administered 2022-11-24: 4 mg via INTRAVENOUS
  Filled 2022-11-24: qty 1

## 2022-11-24 MED ORDER — ACETAMINOPHEN 500 MG PO TABS
1000.0000 mg | ORAL_TABLET | Freq: Four times a day (QID) | ORAL | Status: DC
  Administered 2022-11-24 – 2022-11-25 (×3): 1000 mg via ORAL
  Filled 2022-11-24 (×3): qty 2

## 2022-11-24 MED ORDER — PHENYLEPHRINE HCL-NACL 20-0.9 MG/250ML-% IV SOLN
INTRAVENOUS | Status: DC | PRN
  Administered 2022-11-24: 25 ug/min via INTRAVENOUS

## 2022-11-24 MED ORDER — INSULIN ASPART 100 UNIT/ML IJ SOLN: 0.0000 [IU] | INTRAMUSCULAR | Status: DC | PRN

## 2022-11-24 MED ORDER — HYDROCHLOROTHIAZIDE 12.5 MG PO TABS
12.5000 mg | ORAL_TABLET | Freq: Every day | ORAL | Status: DC
  Administered 2022-11-24 – 2022-11-25 (×2): 12.5 mg via ORAL
  Filled 2022-11-24 (×2): qty 1

## 2022-11-24 MED ORDER — BUPIVACAINE HCL (PF) 0.25 % IJ SOLN
INTRAMUSCULAR | Status: DC | PRN
  Administered 2022-11-24: 5 mL

## 2022-11-24 MED ORDER — METHOCARBAMOL 1000 MG/10ML IJ SOLN: 500.0000 mg | Freq: Four times a day (QID) | INTRAVENOUS | Status: DC | PRN

## 2022-11-24 MED ORDER — CEFAZOLIN SODIUM-DEXTROSE 2-4 GM/100ML-% IV SOLN
2.0000 g | INTRAVENOUS | Status: AC
  Administered 2022-11-24: 2 g via INTRAVENOUS
  Filled 2022-11-24: qty 100

## 2022-11-24 MED ORDER — MORPHINE SULFATE (PF) 2 MG/ML IV SOLN: 2.0000 mg | INTRAVENOUS | Status: DC | PRN

## 2022-11-24 MED ORDER — SODIUM CHLORIDE 0.9% FLUSH: 3.0000 mL | Freq: Two times a day (BID) | INTRAVENOUS | Status: DC

## 2022-11-24 MED ORDER — SODIUM CHLORIDE 0.9 % IV SOLN
250.0000 mL | INTRAVENOUS | Status: DC
  Administered 2022-11-24: 250 mL via INTRAVENOUS

## 2022-11-24 MED ORDER — OXYCODONE HCL 5 MG PO TABS: 5.0000 mg | ORAL_TABLET | ORAL | Status: DC | PRN

## 2022-11-24 MED ORDER — METFORMIN HCL 500 MG PO TABS
1000.0000 mg | ORAL_TABLET | Freq: Two times a day (BID) | ORAL | Status: DC
  Administered 2022-11-24 – 2022-11-25 (×2): 1000 mg via ORAL
  Filled 2022-11-24 (×2): qty 2

## 2022-11-24 MED ORDER — PROPOFOL 10 MG/ML IV BOLUS
INTRAVENOUS | Status: AC
  Filled 2022-11-24: qty 20

## 2022-11-24 MED ORDER — SUGAMMADEX SODIUM 200 MG/2ML IV SOLN
INTRAVENOUS | Status: DC | PRN
  Administered 2022-11-24: 200 mg via INTRAVENOUS

## 2022-11-24 MED ORDER — ROCURONIUM BROMIDE 10 MG/ML (PF) SYRINGE
PREFILLED_SYRINGE | INTRAVENOUS | Status: AC
  Filled 2022-11-24: qty 10

## 2022-11-24 MED ORDER — ACETAMINOPHEN 325 MG PO TABS: 325.0000 mg | ORAL_TABLET | ORAL | Status: DC | PRN

## 2022-11-24 MED ORDER — LIDOCAINE 2% (20 MG/ML) 5 ML SYRINGE
INTRAMUSCULAR | Status: AC
  Filled 2022-11-24: qty 5

## 2022-11-24 MED ORDER — FENTANYL CITRATE (PF) 100 MCG/2ML IJ SOLN
25.0000 ug | INTRAMUSCULAR | Status: DC | PRN
  Administered 2022-11-24: 50 ug via INTRAVENOUS

## 2022-11-24 MED ORDER — DEXAMETHASONE 4 MG PO TABS
4.0000 mg | ORAL_TABLET | Freq: Four times a day (QID) | ORAL | Status: DC
  Administered 2022-11-24 – 2022-11-25 (×2): 4 mg via ORAL
  Filled 2022-11-24 (×2): qty 1

## 2022-11-24 MED ORDER — METHOCARBAMOL 500 MG PO TABS
500.0000 mg | ORAL_TABLET | Freq: Four times a day (QID) | ORAL | Status: DC | PRN
  Administered 2022-11-24: 500 mg via ORAL
  Filled 2022-11-24: qty 1

## 2022-11-24 MED ORDER — PHENYLEPHRINE 80 MCG/ML (10ML) SYRINGE FOR IV PUSH (FOR BLOOD PRESSURE SUPPORT)
PREFILLED_SYRINGE | INTRAVENOUS | Status: DC | PRN
  Administered 2022-11-24: 80 ug via INTRAVENOUS

## 2022-11-24 MED ORDER — SODIUM CHLORIDE 0.9% FLUSH: 3.0000 mL | INTRAVENOUS | Status: DC | PRN

## 2022-11-24 MED ORDER — ROCURONIUM BROMIDE 10 MG/ML (PF) SYRINGE
PREFILLED_SYRINGE | INTRAVENOUS | Status: DC | PRN
  Administered 2022-11-24: 50 mg via INTRAVENOUS

## 2022-11-24 MED ORDER — LIDOCAINE 2% (20 MG/ML) 5 ML SYRINGE
INTRAMUSCULAR | Status: DC | PRN
  Administered 2022-11-24: 40 mg via INTRAVENOUS

## 2022-11-24 MED ORDER — ONDANSETRON HCL 4 MG PO TABS: 4.0000 mg | ORAL_TABLET | Freq: Four times a day (QID) | ORAL | Status: DC | PRN

## 2022-11-24 MED ORDER — INSULIN ASPART 100 UNIT/ML IJ SOLN: 0.0000 [IU] | Freq: Three times a day (TID) | INTRAMUSCULAR | Status: DC

## 2022-11-24 MED ORDER — 0.9 % SODIUM CHLORIDE (POUR BTL) OPTIME
TOPICAL | Status: DC | PRN
  Administered 2022-11-24: 1000 mL

## 2022-11-24 MED ORDER — ONDANSETRON HCL 4 MG/2ML IJ SOLN: 4.0000 mg | Freq: Four times a day (QID) | INTRAMUSCULAR | Status: DC | PRN

## 2022-11-24 MED ORDER — LOSARTAN POTASSIUM 50 MG PO TABS
100.0000 mg | ORAL_TABLET | Freq: Every day | ORAL | Status: DC
  Administered 2022-11-24 – 2022-11-25 (×2): 100 mg via ORAL
  Filled 2022-11-24 (×2): qty 2

## 2022-11-24 MED ORDER — CHLORHEXIDINE GLUCONATE CLOTH 2 % EX PADS: 6.0000 | MEDICATED_PAD | Freq: Once | CUTANEOUS | Status: DC

## 2022-11-24 MED ORDER — ONDANSETRON HCL 4 MG/2ML IJ SOLN
INTRAMUSCULAR | Status: DC | PRN
  Administered 2022-11-24: 4 mg via INTRAVENOUS

## 2022-11-24 MED ORDER — TAMSULOSIN HCL 0.4 MG PO CAPS
0.4000 mg | ORAL_CAPSULE | Freq: Every day | ORAL | Status: DC
  Administered 2022-11-24 – 2022-11-25 (×2): 0.4 mg via ORAL
  Filled 2022-11-24 (×2): qty 1

## 2022-11-24 MED ORDER — LOSARTAN POTASSIUM-HCTZ 100-12.5 MG PO TABS: 1.0000 | ORAL_TABLET | Freq: Every day | ORAL | Status: DC

## 2022-11-24 MED ORDER — DEXAMETHASONE SODIUM PHOSPHATE 10 MG/ML IJ SOLN
INTRAMUSCULAR | Status: AC
  Filled 2022-11-24: qty 1

## 2022-11-24 MED ORDER — ONDANSETRON HCL 4 MG/2ML IJ SOLN
INTRAMUSCULAR | Status: AC
  Filled 2022-11-24: qty 2

## 2022-11-24 MED ORDER — FENTANYL CITRATE (PF) 100 MCG/2ML IJ SOLN
INTRAMUSCULAR | Status: AC
  Filled 2022-11-24: qty 2

## 2022-11-24 MED ORDER — POTASSIUM CHLORIDE IN NACL 20-0.9 MEQ/L-% IV SOLN: INTRAVENOUS | Status: DC

## 2022-11-24 MED ORDER — ACETAMINOPHEN 325 MG PO TABS: 650.0000 mg | ORAL_TABLET | ORAL | Status: DC | PRN

## 2022-11-24 MED ORDER — OXYCODONE HCL 5 MG PO TABS: 5.0000 mg | ORAL_TABLET | Freq: Once | ORAL | Status: DC | PRN

## 2022-11-24 MED ORDER — PROMETHAZINE HCL 25 MG/ML IJ SOLN: 6.2500 mg | INTRAMUSCULAR | Status: DC | PRN

## 2022-11-24 MED ORDER — SENNA 8.6 MG PO TABS
1.0000 | ORAL_TABLET | Freq: Two times a day (BID) | ORAL | Status: DC
  Administered 2022-11-24 – 2022-11-25 (×2): 8.6 mg via ORAL
  Filled 2022-11-24 (×2): qty 1

## 2022-11-24 MED ORDER — BUPIVACAINE HCL (PF) 0.25 % IJ SOLN
INTRAMUSCULAR | Status: AC
  Filled 2022-11-24: qty 30

## 2022-11-24 MED ORDER — AMISULPRIDE (ANTIEMETIC) 5 MG/2ML IV SOLN: 10.0000 mg | Freq: Once | INTRAVENOUS | Status: DC | PRN

## 2022-11-24 MED ORDER — MENTHOL 3 MG MT LOZG: 1.0000 | LOZENGE | OROMUCOSAL | Status: DC | PRN

## 2022-11-24 MED ORDER — THROMBIN 5000 UNITS EX SOLR
CUTANEOUS | Status: AC
  Filled 2022-11-24: qty 15000

## 2022-11-24 MED ORDER — CHLORHEXIDINE GLUCONATE 0.12 % MT SOLN
15.0000 mL | Freq: Once | OROMUCOSAL | Status: AC
  Administered 2022-11-24: 15 mL via OROMUCOSAL
  Filled 2022-11-24: qty 15

## 2022-11-24 MED ORDER — ACETAMINOPHEN 650 MG RE SUPP: 650.0000 mg | RECTAL | Status: DC | PRN

## 2022-11-24 MED ORDER — THROMBIN 5000 UNITS EX SOLR: OROMUCOSAL | Status: DC | PRN

## 2022-11-24 MED ORDER — GABAPENTIN 300 MG PO CAPS
300.0000 mg | ORAL_CAPSULE | ORAL | Status: AC
  Administered 2022-11-24: 300 mg via ORAL
  Filled 2022-11-24: qty 1

## 2022-11-24 MED ORDER — ORAL CARE MOUTH RINSE: 15.0000 mL | Freq: Once | OROMUCOSAL | Status: AC

## 2022-11-24 MED ORDER — INSULIN ASPART 100 UNIT/ML IJ SOLN: 0.0000 [IU] | Freq: Every day | INTRAMUSCULAR | Status: DC

## 2022-11-24 MED ORDER — ACETAMINOPHEN 160 MG/5ML PO SOLN: 325.0000 mg | ORAL | Status: DC | PRN

## 2022-11-24 MED ORDER — PHENYLEPHRINE 80 MCG/ML (10ML) SYRINGE FOR IV PUSH (FOR BLOOD PRESSURE SUPPORT)
PREFILLED_SYRINGE | INTRAVENOUS | Status: AC
  Filled 2022-11-24: qty 10

## 2022-11-24 MED ORDER — LACTATED RINGERS IV SOLN: INTRAVENOUS | Status: DC

## 2022-11-24 MED ORDER — ACETAMINOPHEN 10 MG/ML IV SOLN: 1000.0000 mg | Freq: Once | INTRAVENOUS | Status: DC | PRN

## 2022-11-24 MED ORDER — FENTANYL CITRATE (PF) 250 MCG/5ML IJ SOLN
INTRAMUSCULAR | Status: AC
  Filled 2022-11-24: qty 5

## 2022-11-24 MED ORDER — ACETAMINOPHEN 500 MG PO TABS
1000.0000 mg | ORAL_TABLET | Freq: Once | ORAL | Status: AC
  Administered 2022-11-24: 1000 mg via ORAL
  Filled 2022-11-24: qty 2

## 2022-11-24 MED ORDER — CEFAZOLIN SODIUM-DEXTROSE 2-4 GM/100ML-% IV SOLN
2.0000 g | Freq: Three times a day (TID) | INTRAVENOUS | Status: AC
  Administered 2022-11-24 – 2022-11-25 (×2): 2 g via INTRAVENOUS
  Filled 2022-11-24 (×2): qty 100

## 2022-11-24 SURGICAL SUPPLY — 50 items
APL SKNCLS STERI-STRIP NONHPOA (GAUZE/BANDAGES/DRESSINGS) ×1
BAG COUNTER SPONGE SURGICOUNT (BAG) ×1 IMPLANT
BAG SPNG CNTER NS LX DISP (BAG) ×1
BASKET BONE COLLECTION (BASKET) IMPLANT
BENZOIN TINCTURE PRP APPL 2/3 (GAUZE/BANDAGES/DRESSINGS) ×1 IMPLANT
BUR CARBIDE MATCH 3.0 (BURR) ×1 IMPLANT
CANISTER SUCT 3000ML PPV (MISCELLANEOUS) ×1 IMPLANT
DRAPE C-ARM 42X72 X-RAY (DRAPES) ×2 IMPLANT
DRAPE LAPAROTOMY 100X72 PEDS (DRAPES) ×1 IMPLANT
DRAPE MICROSCOPE SLANT 54X150 (MISCELLANEOUS) ×1 IMPLANT
DRSG OPSITE 4X5.5 SM (GAUZE/BANDAGES/DRESSINGS) IMPLANT
DURAPREP 6ML APPLICATOR 50/CS (WOUND CARE) ×1 IMPLANT
ELECT COATED BLADE 2.86 ST (ELECTRODE) ×1 IMPLANT
ELECT REM PT RETURN 9FT ADLT (ELECTROSURGICAL) ×1
ELECTRODE REM PT RTRN 9FT ADLT (ELECTROSURGICAL) ×1 IMPLANT
GAUZE 4X4 16PLY ~~LOC~~+RFID DBL (SPONGE) IMPLANT
GLOVE BIO SURGEON STRL SZ7 (GLOVE) IMPLANT
GLOVE BIO SURGEON STRL SZ8 (GLOVE) ×1 IMPLANT
GLOVE BIOGEL PI IND STRL 7.0 (GLOVE) IMPLANT
GOWN STRL REUS W/ TWL LRG LVL3 (GOWN DISPOSABLE) IMPLANT
GOWN STRL REUS W/ TWL XL LVL3 (GOWN DISPOSABLE) IMPLANT
GOWN STRL REUS W/TWL 2XL LVL3 (GOWN DISPOSABLE) ×1 IMPLANT
GOWN STRL REUS W/TWL LRG LVL3 (GOWN DISPOSABLE)
GOWN STRL REUS W/TWL XL LVL3 (GOWN DISPOSABLE)
HALTER HD/CHIN CERV TRACTION D (MISCELLANEOUS) IMPLANT
HEMOSTAT POWDER KIT SURGIFOAM (HEMOSTASIS) ×1 IMPLANT
KIT BASIN OR (CUSTOM PROCEDURE TRAY) ×1 IMPLANT
KIT TURNOVER KIT B (KITS) ×1 IMPLANT
NDL HYPO 25X1 1.5 SAFETY (NEEDLE) ×1 IMPLANT
NDL SPNL 20GX3.5 QUINCKE YW (NEEDLE) ×1 IMPLANT
NEEDLE HYPO 25X1 1.5 SAFETY (NEEDLE) ×1 IMPLANT
NEEDLE SPNL 20GX3.5 QUINCKE YW (NEEDLE) ×1 IMPLANT
NS IRRIG 1000ML POUR BTL (IV SOLUTION) ×1 IMPLANT
PACK LAMINECTOMY NEURO (CUSTOM PROCEDURE TRAY) ×1 IMPLANT
PAD ARMBOARD 7.5X6 YLW CONV (MISCELLANEOUS) ×3 IMPLANT
PIN DISTRACTION 14MM (PIN) ×2 IMPLANT
PLATE LOCK INSIGNIA 24 1L (Plate) IMPLANT
PUTTY BONE 1CC (Putty) IMPLANT
SCREW VA SINGLE LEAD 4X16 (Screw) IMPLANT
SOL ELECTROSURG ANTI STICK (MISCELLANEOUS)
SOLUTION ELECTROSURG ANTI STCK (MISCELLANEOUS) ×1 IMPLANT
SPACER IDENT 7X18X16 7D (Spacer) IMPLANT
SPACER IDENTITI 7X18X16 7D (Spacer) ×1 IMPLANT
SPONGE INTESTINAL PEANUT (DISPOSABLE) ×1 IMPLANT
STRIP CLOSURE SKIN 1/2X4 (GAUZE/BANDAGES/DRESSINGS) ×1 IMPLANT
SUT VIC AB 3-0 SH 8-18 (SUTURE) ×1 IMPLANT
SUT VICRYL 4-0 PS2 18IN ABS (SUTURE) IMPLANT
TOWEL GREEN STERILE (TOWEL DISPOSABLE) ×1 IMPLANT
TOWEL GREEN STERILE FF (TOWEL DISPOSABLE) ×1 IMPLANT
WATER STERILE IRR 1000ML POUR (IV SOLUTION) ×1 IMPLANT

## 2022-11-24 NOTE — Anesthesia Postprocedure Evaluation (Signed)
Anesthesia Post Note  Patient: Gregg Moon  Procedure(s) Performed: Anterior Cervical Discectomy Fusion-Cervical Three-Cervical Four, Remove Plate Cervical Four-Cervical Six (Spine Cervical)     Patient location during evaluation: PACU Anesthesia Type: General Level of consciousness: awake and alert Pain management: pain level controlled Vital Signs Assessment: post-procedure vital signs reviewed and stable Respiratory status: spontaneous breathing, nonlabored ventilation, respiratory function stable and patient connected to nasal cannula oxygen Cardiovascular status: blood pressure returned to baseline and stable Postop Assessment: no apparent nausea or vomiting Anesthetic complications: no  No notable events documented.  Last Vitals:  Vitals:   11/24/22 1530 11/24/22 1554  BP: (!) 121/93 (!) 175/97  Pulse: 65 63  Resp: (!) 21 20  Temp:  36.5 C  SpO2: 100% 100%    Last Pain:  Vitals:   11/24/22 1554  TempSrc: Oral  PainSc:                  Shelton Silvas

## 2022-11-24 NOTE — Plan of Care (Signed)

## 2022-11-24 NOTE — Transfer of Care (Signed)
Immediate Anesthesia Transfer of Care Note  Patient: Gregg Moon  Procedure(s) Performed: Anterior Cervical Discectomy Fusion-Cervical Three-Cervical Four, Remove Plate Cervical Four-Cervical Six (Spine Cervical)  Patient Location: PACU  Anesthesia Type:General  Level of Consciousness: awake and drowsy  Airway & Oxygen Therapy: Patient Spontanous Breathing  Post-op Assessment: Report given to RN and Post -op Vital signs reviewed and stable  Post vital signs: Reviewed and stable  Last Vitals:  Vitals Value Taken Time  BP 152/62 11/24/22 1435  Temp    Pulse 62 11/24/22 1437  Resp 11 11/24/22 1437  SpO2 95 % 11/24/22 1437  Vitals shown include unvalidated device data.  Last Pain:  Vitals:   11/24/22 0742  TempSrc:   PainSc: 0-No pain      Patients Stated Pain Goal: 0 (11/24/22 0742)  Complications: No notable events documented.

## 2022-11-24 NOTE — Anesthesia Procedure Notes (Signed)
Procedure Name: Intubation Date/Time: 11/24/2022 12:02 PM  Performed by: Nils Pyle, CRNAPre-anesthesia Checklist: Patient identified, Emergency Drugs available, Suction available and Patient being monitored Patient Re-evaluated:Patient Re-evaluated prior to induction Oxygen Delivery Method: Circle System Utilized Preoxygenation: Pre-oxygenation with 100% oxygen Induction Type: IV induction Ventilation: Mask ventilation without difficulty Laryngoscope Size: Glidescope and 4 Grade View: Grade I Tube type: Oral Tube size: 7.5 mm Number of attempts: 1 Airway Equipment and Method: Stylet and Oral airway Placement Confirmation: ETT inserted through vocal cords under direct vision, positive ETCO2 and breath sounds checked- equal and bilateral Secured at: 23 cm Tube secured with: Tape Dental Injury: Teeth and Oropharynx as per pre-operative assessment

## 2022-11-24 NOTE — Op Note (Signed)
11/24/2022  2:26 PM  PATIENT:  Gregg Moon  72 y.o. male  PRE-OPERATIVE DIAGNOSIS: Cervical spondylosis with cervical spinal stenosis C3-4 with neck pain and radiculopathy  POST-OPERATIVE DIAGNOSIS:  same  PROCEDURE:  1. Decompressive anterior cervical discectomy C3-4, 2. Anterior cervical arthrodesis C3-4 utilizing a PTI interbody cage packed with locally harvested morcellized autologous bone graft and DBM putty, 3. Anterior cervical plating C3-4 utilizing a ATEC plate, 4.  Removal of anterior cervical plate Z6-X0  SURGEON:  Marikay Alar, MD  ASSISTANTS: Verlin Dike, FNP  ANESTHESIA:   General  EBL: 20 ml  Total I/O In: 1100 [I.V.:1000; IV Piggyback:100] Out: 20 [Blood:20]  BLOOD ADMINISTERED: none  DRAINS: none  SPECIMEN:  none  INDICATION FOR PROCEDURE: This patient presented with neck pain with shoulder pain and left arm pain. Imaging showed cervical spinal stenosis C3-4 with cord compression. The patient tried conservative measures without relief. Pain was debilitating. Recommended ACDF with plating. Patient understood the risks, benefits, and alternatives and potential outcomes and wished to proceed.  PROCEDURE DETAILS: Patient was brought to the operating room placed under general endotracheal anesthesia. Patient was placed in the supine position on the operating room table. The neck was prepped with Duraprep and draped in a sterile fashion.   Three cc of local anesthesia was injected and a transverse incision was made on the right side of the neck.  Dissection was carried down thru the subcutaneous tissue and the platysma was  elevated, opened, and undermined with Metzenbaum scissors.  Dissection was then carried out thru an avascular plane leaving the sternocleidomastoid carotid artery and jugular vein laterally and the trachea and esophagus medially with the assistance of my nurse practitioner. The ventral aspect of the vertebral column was identified and we  identified the previously placed anterior cervical plate from R6-E4.  We removed the soft tissue from the anterior part of the plate and remove the screws after unlocking the locking mechanism at each level.  I then removed the plate and placed Surgifoam into the holes to control any oozing from the screw holes.  a localizing x-ray was taken. The C3-4 level was identified and all in the room agreed with the level. The longus colli muscles were then elevated and the retractor was placed with the assistance of my nurse practitioner.  There is a large anterior osteophyte on the front of C3 that was removed with a Leksell rongeur and then further drilled down with the high-speed drill.  The annulus was incised and the disc space entered. Discectomy was performed with micro-curettes and pituitary rongeurs. I then used the high-speed drill to drill the endplates down to the level of the posterior longitudinal ligament. The drill shavings were saved in a mucous trap for later arthrodesis. The operating microscope was draped and brought into the field provided additional magnification, illumination and visualization. Discectomy was continued posteriorly thru the disc space. Posterior longitudinal ligament was opened with a nerve hook, and then removed along with disc herniation and osteophytes, decompressing the spinal canal and thecal sac. We then continued to remove osteophytic overgrowth and disc material decompressing the neural foramina and exiting nerve roots bilaterally. The scope was angled up and down to help decompress and undercut the vertebral bodies. Once the decompression was completed we could pass a nerve hook circumferentially to assure adequate decompression in the midline and in the neural foramina. So by both visualization and palpation we felt we had an adequate decompression of the neural elements. We then measured  the height of the intravertebral disc space and selected a 7 millimeter PTI interbody  cage packed with autograft and DBM. It was then gently positioned in the intravertebral disc space(s) and countersunk. I then used a 24 mm ATEC plate and placed 16 mm variable angle screws into the vertebral bodies of each level and locked them into position. The wound was irrigated with bacitracin solution, checked for hemostasis which was established and confirmed. Once meticulous hemostasis was achieved, we then proceeded with closure with the assistance of my nurse practitioner. The platysma was closed with interrupted 3-0 undyed Vicryl suture, the subcuticular layer was closed with interrupted 3-0 undyed Vicryl suture. The skin edges were approximated with steristrips. The drapes were removed. A sterile dressing was applied. The patient was then awakened from general anesthesia and transferred to the recovery room in stable condition. At the end of the procedure all sponge, needle and instrument counts were correct.   PLAN OF CARE: Admit to inpatient   PATIENT DISPOSITION:  PACU - hemodynamically stable.   Delay start of Pharmacological VTE agent (>24hrs) due to surgical blood loss or risk of bleeding:  yes

## 2022-11-24 NOTE — Consult Note (Signed)
Reason for Consult: Check larynx Referring Physician: Arman Bogus, MD  Gregg Moon is an 72 y.o. male.  HPI: History of 2 past surgeries in the neck 1 with a right-sided approach 1 the left side now facing cervical spine surgery.  Needs to have preop evaluation of vocal cord function.  No complaints related to his voice.  Past Medical History:  Diagnosis Date   Anxiety    was on meds but has been off since ~ 2010   Arthritis    "neck" (03/07/2017)   Carotid artery occlusion    Diabetes mellitus without complication (HCC)    Type 2   Hyperlipidemia    takes Zocor daily   Hypertension    takes Benicar daily   Left carotid stenosis    Neck pain    stenosis   Peripheral vascular disease (HCC)    Carotid Endarterectomy 2018   Weakness    numbness and tingling to the left arm    Past Surgical History:  Procedure Laterality Date   ANTERIOR CERVICAL CORPECTOMY N/A 07/03/2013   Procedure: C/5 Corpectomy w/strut graft + plating;  Surgeon: Tia Alert, MD;  Location: [facility];  Service: Neurosurgery;  Laterality: N/A;   BACK SURGERY     CAROTID ENDARTERECTOMY Left 03/07/2017   COLONOSCOPY  2023   COLONOSCOPY WITH PROPOFOL N/A 03/21/2016   Procedure: COLONOSCOPY WITH PROPOFOL;  Surgeon: Charolett Bumpers, MD;  Location: [facility];  Service: Endoscopy;  Laterality: N/A;   ENDARTERECTOMY Left 03/07/2017   Procedure: ENDARTERECTOMY CAROTID LEFT;  Surgeon: Nada Libman, MD;  Location: [facility];  Service: Vascular;  Laterality: Left;   INGUINAL HERNIA REPAIR Bilateral    PATCH ANGIOPLASTY Left 03/07/2017   carotid   PATCH ANGIOPLASTY Left 03/07/2017   Procedure: PATCH ANGIOPLASTY LEFT CAROTID ARTERY;  Surgeon: Nada Libman, MD;  Location: [facility];  Service: Vascular;  Laterality: Left;    Family History  Problem Relation Age of Onset   Heart disease Mother    Diabetes Mother    Heart disease Father    Hypertension Sister    Diabetes Sister     Social  History:  reports that he quit smoking about 36 years ago. His smoking use included cigarettes. He has a 30.00 pack-year smoking history. He has never used smokeless tobacco. He reports that he does not currently use alcohol. He reports that he does not use drugs.  Allergies:  Allergies  Allergen Reactions   Naproxen Other (See Comments)    States "makes him act out"   Atorvastatin Other (See Comments)   Hydrocodone     All pain meds make him "crazy".   Other Other (See Comments)    A blood pressure pill but unsure of name-gave bad headache A blood pressure pill but unsure of name-gave bad headache   Ramipril Other (See Comments)    Nervous   Sertraline Hcl Other (See Comments)    Unknown    Medications: Reviewed  Results for orders placed or performed during the hospital encounter of 11/24/22 (from the past 48 hour(s))  Glucose, capillary     Status: Abnormal   Collection Time: 11/24/22  7:38 AM  Result Value Ref Range   Glucose-Capillary 165 (H) 70 - 99 mg/dL    Comment: Glucose reference range applies only to samples taken after fasting for at least 8 hours.  Glucose, capillary     Status: Abnormal   Collection Time: 11/24/22  9:27 AM  Result  Value Ref Range   Glucose-Capillary 131 (H) 70 - 99 mg/dL    Comment: Glucose reference range applies only to samples taken after fasting for at least 8 hours.  Glucose, capillary     Status: Abnormal   Collection Time: 11/24/22 11:23 AM  Result Value Ref Range   Glucose-Capillary 143 (H) 70 - 99 mg/dL    Comment: Glucose reference range applies only to samples taken after fasting for at least 8 hours.    No results found.  [id number] except as listed in admit H&P  Blood pressure (!) 163/75, pulse 74, temperature 98.2 F (36.8 C), temperature source Oral, resp. rate 18, height 5\' 9"  (1.753 m), weight 78 kg, SpO2 99 %.  PHYSICAL EXAM: Overall appearance:  Healthy appearing, in no distress, breathing well with clear healthy  voice ears appear normal. Nose: External nose is healthy in appearance. Internal nasal exam free of any lesions or obstruction. Oral Cavity/Pharynx:  There are no mucosal lesions or masses identified. Larynx/Hypopharynx: Fiberoptic laryngoscopy reveals healthy normal hypopharynx and larynx with normal vocal cord mobility. Neuro:  No identifiable neurologic deficits. Neck: No palpable neck masses.  Studies Reviewed: none  Procedures: Flexible fiberoptic laryngoscopy:  Topical Xylocaine/Afrin applied in spray form.  Flexible scope passed on the right side.  All structures are normal and functioning normally with good vocal cord mobility.   Assessment/Plan: Normal vocal cord function.  Proceed with surgery.   J81.191  Medical Decision Making: #/Complex Problems: 1  Data Reviewed:1  Management:1 (1-Straightforward, 2-Low, 3-Moderate, 4-High)   Serena Colonel 11/24/2022, 11:48 AM

## 2022-11-24 NOTE — H&P (Signed)
Subjective:   Patient is a 72 y.o. male admitted for neck pain . The patient first presented to me with complaints of neck pain. Onset of symptoms was several months ago. The pain is described as aching and dull and occurs all day. The pain is rated moderate, and is located in the neck and radiates to the LUE to the thumb at times. The symptoms have been progressive. Symptoms are exacerbated by extending head backwards, and are relieved by none.  Previous work up includes MRI of cervical spine, results: spinal stenosis.  Past Medical History:  Diagnosis Date   Anxiety    was on meds but has been off since ~ 2010   Arthritis    "neck" (03/07/2017)   Carotid artery occlusion    Diabetes mellitus without complication (HCC)    Type 2   Hyperlipidemia    takes Zocor daily   Hypertension    takes Benicar daily   Left carotid stenosis    Neck pain    stenosis   Peripheral vascular disease (HCC)    Carotid Endarterectomy 2018   Weakness    numbness and tingling to the left arm    Past Surgical History:  Procedure Laterality Date   ANTERIOR CERVICAL CORPECTOMY N/A 07/03/2013   Procedure: C/5 Corpectomy w/strut graft + plating;  Surgeon: Tia Alert, MD;  Location: [facility];  Service: Neurosurgery;  Laterality: N/A;   BACK SURGERY     CAROTID ENDARTERECTOMY Left 03/07/2017   COLONOSCOPY  2023   COLONOSCOPY WITH PROPOFOL N/A 03/21/2016   Procedure: COLONOSCOPY WITH PROPOFOL;  Surgeon: Charolett Bumpers, MD;  Location: [facility];  Service: Endoscopy;  Laterality: N/A;   ENDARTERECTOMY Left 03/07/2017   Procedure: ENDARTERECTOMY CAROTID LEFT;  Surgeon: Nada Libman, MD;  Location: [facility];  Service: Vascular;  Laterality: Left;   INGUINAL HERNIA REPAIR Bilateral    PATCH ANGIOPLASTY Left 03/07/2017   carotid   PATCH ANGIOPLASTY Left 03/07/2017   Procedure: PATCH ANGIOPLASTY LEFT CAROTID ARTERY;  Surgeon: Nada Libman, MD;  Location: [facility];  Service: Vascular;  Laterality:  Left;    Allergies  Allergen Reactions   Naproxen Other (See Comments)    States "makes him act out"   Atorvastatin Other (See Comments)   Hydrocodone     All pain meds make him "crazy".   Other Other (See Comments)    A blood pressure pill but unsure of name-gave bad headache A blood pressure pill but unsure of name-gave bad headache   Ramipril Other (See Comments)    Nervous   Sertraline Hcl Other (See Comments)    Unknown    Social History   Tobacco Use   Smoking status: Former    Packs/day: 1.50    Years: 20.00    Additional pack years: 0.00    Total pack years: 30.00    Types: Cigarettes    Quit date: 03/17/1986    Years since quitting: 36.7   Smokeless tobacco: Never  Substance Use Topics   Alcohol use: Not Currently    Comment: Occasionally    Family History  Problem Relation Age of Onset   Heart disease Mother    Diabetes Mother    Heart disease Father    Hypertension Sister    Diabetes Sister    Prior to Admission medications   Medication Sig Start Date End Date Taking? Authorizing Provider  aspirin EC 81 MG tablet Take 81 mg by mouth daily.   Yes [provider]  famotidine (PEPCID) 40 MG tablet Take 40 mg by mouth daily. 12/13/21  Yes [provider]  losartan-hydrochlorothiazide (HYZAAR) 100-12.5 MG tablet Take 1 tablet by mouth daily.   Yes [provider]  metFORMIN (GLUCOPHAGE) 1000 MG tablet Take 1,000 mg by mouth 2 (two) times daily. 10/24/22  Yes [provider]  rosuvastatin (CRESTOR) 10 MG tablet Take 10 mg by mouth at bedtime.   Yes [provider]  sildenafil (VIAGRA) 100 MG tablet Take 100 mg by mouth daily as needed for erectile dysfunction.   Yes [provider]  tamsulosin (FLOMAX) 0.4 MG CAPS capsule Take 0.4 mg by mouth daily. 06/26/20  Yes [provider]     Review of Systems  Positive ROS: neg  All other systems have been reviewed and were otherwise negative with the  exception of those mentioned in the HPI and as above.  Objective: Vital signs in last 24 hours: Temp:  [98.2 F (36.8 C)] 98.2 F (36.8 C) (06/28 0732) Pulse Rate:  [74] 74 (06/28 0732) Resp:  [18] 18 (06/28 0732) BP: (163)/(75) 163/75 (06/28 0732) SpO2:  [99 %] 99 % (06/28 0732) Weight:  [78 kg] 78 kg (06/28 0732)  General Appearance: Alert, cooperative, no distress, appears stated age Head: Normocephalic, without obvious abnormality, atraumatic Eyes: PERRL, conjunctiva/corneas clear, EOM's intact      Neck: Supple, symmetrical, trachea midline, Back: Symmetric, no curvature, ROM normal, no CVA tenderness Lungs:  respirations unlabored Heart: Regular rate and rhythm Abdomen: Soft, non-tender Extremities: Extremities normal, atraumatic, no cyanosis or edema Pulses: 2+ and symmetric all extremities Skin: Skin color, texture, turgor normal, no rashes or lesions  NEUROLOGIC:  Mental status: Alert and oriented x4, no aphasia, good attention span, fund of knowledge and memory  Motor Exam - grossly normal Sensory Exam - grossly normal Reflexes: 1= Coordination - grossly normal Gait - grossly normal Balance - grossly normal Cranial Nerves: I: smell Not tested  II: visual acuity  OS: nl    OD: nl  II: visual fields Full to confrontation  II: pupils Equal, round, reactive to light  III,VII: ptosis None  III,IV,VI: extraocular muscles  Full ROM  V: mastication Normal  V: facial light touch sensation  Normal  V,VII: corneal reflex  Present  VII: facial muscle function - upper  Normal  VII: facial muscle function - lower Normal  VIII: hearing Not tested  IX: soft palate elevation  Normal  IX,X: gag reflex Present  XI: trapezius strength  5/5  XI: sternocleidomastoid strength 5/5  XI: neck flexion strength  5/5  XII: tongue strength  Normal    Data Review Lab Results  Component Value Date   WBC 3.7 (L) 11/14/2022   HGB 13.4 11/14/2022   HCT 39.5 11/14/2022   MCV 88.0  11/14/2022   PLT 187 11/14/2022   Lab Results  Component Value Date   NA 137 11/14/2022   K 3.9 11/14/2022   CL 105 11/14/2022   CO2 21 (L) 11/14/2022   BUN 14 11/14/2022   CREATININE 0.77 11/14/2022   GLUCOSE 116 (H) 11/14/2022   Lab Results  Component Value Date   INR 0.9 11/14/2022    Assessment:   Cervical neck pain with herniated nucleus pulposus/ spondylosis/ stenosis at C3-4. Estimated body mass index is 25.4 kg/m as calculated from the following:   Height as of this encounter: 5\' 9"  (1.753 m).   Weight as of this encounter: 78 kg.  Patient has failed conservative  therapy. Planned surgery : ACDF C3-4  Plan:   I explained the condition and procedure to the patient and answered any questions.  Patient wishes to proceed with procedure as planned. Understands risks/ benefits/ and expected or typical outcomes.  Tia Alert 11/24/2022 10:18 AM

## 2022-11-25 LAB — GLUCOSE, CAPILLARY: Glucose-Capillary: 144 mg/dL — ABNORMAL HIGH (ref 70–99)

## 2022-11-25 MED ORDER — OXYCODONE HCL 5 MG PO TABS: 5.0000 mg | ORAL_TABLET | Freq: Four times a day (QID) | ORAL | 0 refills | Status: DC | PRN

## 2022-11-25 MED ORDER — OXYCODONE HCL 5 MG PO TABS: 5.0000 mg | ORAL_TABLET | Freq: Four times a day (QID) | ORAL | 0 refills | Status: AC | PRN

## 2022-11-25 MED ORDER — METHOCARBAMOL 500 MG PO TABS: 500.0000 mg | ORAL_TABLET | Freq: Four times a day (QID) | ORAL | 0 refills | Status: AC | PRN

## 2022-11-25 MED ORDER — METHOCARBAMOL 500 MG PO TABS: 500.0000 mg | ORAL_TABLET | Freq: Four times a day (QID) | ORAL | 0 refills | Status: DC | PRN

## 2022-11-25 NOTE — Discharge Summary (Signed)
Physician Discharge Summary  Patient ID: Gregg Moon MRN: [id number] DOB/AGE: 02/21/1951 72 y.o.  Admit date: 11/24/2022 Discharge date: 11/25/2022  Admission Diagnoses: Cervical spinal stenosis   Discharge Diagnoses: Same   Discharged Condition: good  Hospital Course: The patient was admitted on 11/24/2022 and taken to the operating room where the patient underwent ACDF C3-4. The patient tolerated the procedure well and was taken to the recovery room and then to the floor in stable condition. The hospital course was routine. There were no complications. The wound remained clean dry and intact. Pt had appropriate neck soreness. No complaints of arm pain or new N/T/W. The patient remained afebrile with stable vital signs, and tolerated a regular diet. The patient continued to increase activities, and pain was well controlled with oral pain medications.   Consults: None  Significant Diagnostic Studies:  Results for orders placed or performed during the hospital encounter of 11/24/22  Glucose, capillary  Result Value Ref Range   Glucose-Capillary 165 (H) 70 - 99 mg/dL  Glucose, capillary  Result Value Ref Range   Glucose-Capillary 131 (H) 70 - 99 mg/dL  Glucose, capillary  Result Value Ref Range   Glucose-Capillary 143 (H) 70 - 99 mg/dL  Glucose, capillary  Result Value Ref Range   Glucose-Capillary 125 (H) 70 - 99 mg/dL  Glucose, capillary  Result Value Ref Range   Glucose-Capillary 126 (H) 70 - 99 mg/dL  Glucose, capillary  Result Value Ref Range   Glucose-Capillary 197 (H) 70 - 99 mg/dL   Comment 1 Notify RN    Comment 2 Document in Chart   Glucose, capillary  Result Value Ref Range   Glucose-Capillary 144 (H) 70 - 99 mg/dL   Comment 1 Notify RN    Comment 2 Document in Chart     DG Cervical Spine 1 View  Result Date: 11/24/2022 CLINICAL DATA:  ACDF EXAM: DG CERVICAL SPINE - 1 VIEW COMPARISON:  10/10/2022 FINDINGS: One fluoroscopic image obtained during the  performance of the procedure and provided for interpretation only. Image demonstrates ACDF C3-C4, with removal of previously noted C4-C6 ACDF. Fluoroscopy time: 9.3 seconds Cumulative air kerma: 0.96 mGy IMPRESSION: Intraoperative fluoroscopic guidance for ACDF. Electronically Signed   By: Wiliam Ke M.D.   On: 11/24/2022 15:35   DG C-Arm 1-60 Min-No Report  Result Date: 11/24/2022 Fluoroscopy was utilized by the requesting physician.  No radiographic interpretation.   DG C-Arm 1-60 Min-No Report  Result Date: 11/24/2022 Fluoroscopy was utilized by the requesting physician.  No radiographic interpretation.   DG C-Arm 1-60 Min-No Report  Result Date: 11/24/2022 Fluoroscopy was utilized by the requesting physician.  No radiographic interpretation.    Antibiotics:  Anti-infectives (From admission, onward)    Start     Dose/Rate Route Frequency Ordered Stop   11/24/22 2000  ceFAZolin (ANCEF) IVPB 2g/100 mL premix        2 g 200 mL/hr over 30 Minutes Intravenous Every 8 hours 11/24/22 1534 11/25/22 0248   11/24/22 0745  ceFAZolin (ANCEF) IVPB 2g/100 mL premix        2 g 200 mL/hr over 30 Minutes Intravenous On call to O.R. 11/24/22 0736 11/24/22 1225       Discharge Exam: Blood pressure (!) 152/122, pulse 95, temperature 98 F (36.7 C), temperature source Oral, resp. rate 20, height 5\' 9"  (1.753 m), weight 78 kg, SpO2 99 %. Neurologic: Grossly normal Incision clean dry and intact  Discharge Medications:   Allergies as of 11/25/2022  Reactions   Naproxen Other (See Comments)   States "makes him act out"   Atorvastatin Other (See Comments)   Hydrocodone    All pain meds make him "crazy".   Other Other (See Comments)   A blood pressure pill but unsure of name-gave bad headache A blood pressure pill but unsure of name-gave bad headache   Ramipril Other (See Comments)   Nervous   Sertraline Hcl Other (See Comments)   Unknown        Medication List     TAKE these  medications    aspirin EC 81 MG tablet Take 81 mg by mouth daily.   famotidine 40 MG tablet Commonly known as: PEPCID Take 40 mg by mouth daily.   losartan-hydrochlorothiazide 100-12.5 MG tablet Commonly known as: HYZAAR Take 1 tablet by mouth daily.   metFORMIN 1000 MG tablet Commonly known as: GLUCOPHAGE Take 1,000 mg by mouth 2 (two) times daily.   methocarbamol 500 MG tablet Commonly known as: ROBAXIN Take 1 tablet (500 mg total) by mouth every 6 (six) hours as needed for muscle spasms.   oxyCODONE 5 MG immediate release tablet Commonly known as: Oxy IR/ROXICODONE Take 1 tablet (5 mg total) by mouth every 6 (six) hours as needed for moderate pain ((score 4 to 6)).   rosuvastatin 10 MG tablet Commonly known as: CRESTOR Take 10 mg by mouth at bedtime.   sildenafil 100 MG tablet Commonly known as: VIAGRA Take 100 mg by mouth daily as needed for erectile dysfunction.   tamsulosin 0.4 MG Caps capsule Commonly known as: FLOMAX Take 0.4 mg by mouth daily.        Disposition: Home   Final Dx: ACDF C3-4  Discharge Instructions     Call MD for:  difficulty breathing, headache or visual disturbances   Complete by: As directed    Call MD for:  persistant nausea and vomiting   Complete by: As directed    Call MD for:  redness, tenderness, or signs of infection (pain, swelling, redness, odor or green/yellow discharge around incision site)   Complete by: As directed    Call MD for:  severe uncontrolled pain   Complete by: As directed    Call MD for:  temperature >100.4   Complete by: As directed    Diet - low sodium heart healthy   Complete by: As directed    Increase activity slowly   Complete by: As directed         Follow-up Information     Arman Bogus, MD. Schedule an appointment as soon as possible for a visit in 2 week(s).   Specialty: Neurosurgery Contact information: [address] [place] [address] [phone]                   Signed: Tia Alert 11/25/2022, 8:59 AM

## 2022-11-25 NOTE — Progress Notes (Signed)
Patient alert and oriented, Gregg Moon's well, voiding adequate amount of urine, swallowing without difficulty, no c/o pain at time of discharge. Patient discharged home with family. Script and discharged instructions given to patient. Patient and spouse stated understanding of instructions given. Patient has an appointment with Dr. Yetta Barre in 2 weeks

## 2022-11-25 NOTE — Evaluation (Signed)
Occupational Therapy Evaluation Patient Details Name: Gregg Moon MRN: [id number] DOB: 1951-01-21 Today's Date: 11/25/2022   History of Present Illness Pt is a 72 y/o M s/p C3-4 ACDF. PMH includes anxiety, arthritis, DM, HLD, HTN, back surgery.   Clinical Impression   Pt reports independence at baseline with ADLs and functional mobility, lives with spouse who can assist at d/c. Pt educated on cervical precautions (handout provided) and compensatory strategies for ADLs, pt dressed upon arrival able to simulate UB/LB ADLs with supervision. Pt needing supervision for bed mobility using log roll  technique and mod I for transfers without AD. Reviewed car transfer technique with pt. Pt presenting with impairments listed below, will follow acutely. Anticipate no OT follow up needs at d/c.      Recommendations for follow up therapy are one component of a multi-disciplinary discharge planning process, led by the attending physician.  Recommendations may be updated based on patient status, additional functional criteria and insurance authorization.   Assistance Recommended at Discharge Set up Supervision/Assistance  Patient can return home with the following Assistance with cooking/housework;Assist for transportation    Functional Status Assessment  Patient has had a recent decline in their functional status and demonstrates the ability to make significant improvements in function in a reasonable and predictable amount of time.  Equipment Recommendations  Other (comment) (pt prefers to obtain/purchase shower chair on his own)    Recommendations for Other Services PT consult     Precautions / Restrictions Precautions Precautions: Cervical Precaution Booklet Issued: Yes (comment) Precaution Comments: pt educated on cervical prec Required Braces or Orthoses: Other Brace Other Brace: no brace needed per MD orders Restrictions Weight Bearing Restrictions: No      Mobility Bed  Mobility Overal bed mobility: Needs Assistance Bed Mobility: Sidelying to Sit, Rolling, Sit to Sidelying Rolling: Supervision Sidelying to sit: Supervision     Sit to sidelying: Supervision General bed mobility comments: using log roll technique    Transfers Overall transfer level: Modified independent Equipment used: None Transfers: Sit to/from Stand Sit to Stand: Modified independent (Device/Increase time)                  Balance Overall balance assessment: No apparent balance deficits (not formally assessed)                                         ADL either performed or assessed with clinical judgement   ADL Overall ADL's : Needs assistance/impaired Eating/Feeding: Modified independent   Grooming: Modified independent   Upper Body Bathing: Supervision/ safety;Sitting   Lower Body Bathing: Supervison/ safety;Sitting/lateral leans;Adhering to back precautions Lower Body Bathing Details (indicate cue type and reason): simulated Upper Body Dressing : Supervision/safety;Sitting   Lower Body Dressing: Supervision/safety;Adhering to back precautions;Sitting/lateral leans;Sit to/from stand Lower Body Dressing Details (indicate cue type and reason): simulated Toilet Transfer: Regular Toilet;Modified Independent   Toileting- Clothing Manipulation and Hygiene: Min guard       Functional mobility during ADLs: Min guard       Vision   Vision Assessment?: No apparent visual deficits     Perception Perception Perception Tested?: No   Praxis Praxis Praxis tested?: Not tested    Pertinent Vitals/Pain Pain Assessment Pain Assessment: No/denies pain     Hand Dominance     Extremity/Trunk Assessment Upper Extremity Assessment Upper Extremity Assessment: Overall WFL for tasks assessed   Lower Extremity  Assessment Lower Extremity Assessment: Overall WFL for tasks assessed   Cervical / Trunk Assessment Cervical / Trunk Assessment: Normal    Communication Communication Communication: No difficulties   Cognition Arousal/Alertness: Awake/alert Behavior During Therapy: WFL for tasks assessed/performed, Restless Overall Cognitive Status: Within Functional Limits for tasks assessed                                       General Comments  VSS on RA    Exercises     Shoulder Instructions      Home Living Family/patient expects to be discharged to:: Private residence Living Arrangements: Spouse/significant other Available Help at Discharge: Family;Available 24 hours/day Type of Home: House Home Access: Level entry     Home Layout: One level     Bathroom Shower/Tub: Tub/shower unit;Walk-in shower (pt reports he plans to use walk in shower)   Bathroom Toilet: Standard     Home Equipment: None   Additional Comments: pt with plans to obtain shower seat      Prior Functioning/Environment Prior Level of Function : Independent/Modified Independent;Driving             Mobility Comments: no AD use ADLs Comments: ind        OT Problem List: Decreased range of motion;Decreased activity tolerance;Impaired balance (sitting and/or standing);Decreased knowledge of precautions      OT Treatment/Interventions: Self-care/ADL training;Therapeutic exercise;Energy conservation;DME and/or AE instruction;Therapeutic activities;Balance training;Patient/family education    OT Goals(Current goals can be found in the care plan section) Acute Rehab OT Goals Patient Stated Goal: none stated OT Goal Formulation: With patient Time For Goal Achievement: 12/09/22 Potential to Achieve Goals: Good  OT Frequency: Min 1X/week    Co-evaluation              AM-PAC OT "6 Clicks" Daily Activity     Outcome Measure Help from another person eating meals?: None Help from another person taking care of personal grooming?: None Help from another person toileting, which includes using toliet, bedpan, or urinal?:  None Help from another person bathing (including washing, rinsing, drying)?: None Help from another person to put on and taking off regular upper body clothing?: None Help from another person to put on and taking off regular lower body clothing?: None 6 Click Score: 24   End of Session Nurse Communication: Mobility status  Activity Tolerance: Patient tolerated treatment well Patient left: in bed;with call bell/phone within reach;with family/visitor present  OT Visit Diagnosis: Muscle weakness (generalized) (M62.81)                Time: 8119-1478 OT Time Calculation (min): 19 min Charges:  OT General Charges $OT Visit: 1 Visit OT Evaluation $OT Eval Low Complexity: 1 Low  Beniah Magnan K, OTD, OTR/L SecureChat Preferred Acute Rehab [phone]   Keonte Daubenspeck K Koonce 11/25/2022, 9:07 AM

## 2022-11-27 ENCOUNTER — Encounter (HOSPITAL_COMMUNITY): Payer: Self-pay | Admitting: Neurological Surgery

## 2023-01-22 ENCOUNTER — Other Ambulatory Visit: Payer: Self-pay

## 2023-01-22 DIAGNOSIS — I6522 Occlusion and stenosis of left carotid artery: Secondary | ICD-10-CM

## 2023-02-05 ENCOUNTER — Ambulatory Visit: Payer: Medicare Other

## 2023-02-05 ENCOUNTER — Ambulatory Visit (HOSPITAL_COMMUNITY): Payer: Medicare Other | Attending: Surgery

## 2023-04-06 ENCOUNTER — Other Ambulatory Visit: Payer: Self-pay

## 2023-04-06 DIAGNOSIS — I6522 Occlusion and stenosis of left carotid artery: Secondary | ICD-10-CM

## 2023-04-23 ENCOUNTER — Ambulatory Visit (HOSPITAL_COMMUNITY)
Admission: RE | Admit: 2023-04-23 | Discharge: 2023-04-23 | Disposition: A | Discharge status: Home / Self Care | Payer: Medicare Other | Source: Ambulatory Visit | Attending: Surgery | Admitting: Surgery

## 2023-04-23 ENCOUNTER — Ambulatory Visit: Payer: Medicare Other | Admitting: Physician Assistant

## 2023-04-23 VITALS — BP 137/73 | HR 64 | Temp 98.3°F | Ht 69.0 in | Wt 167.9 lb

## 2023-04-23 DIAGNOSIS — I6522 Occlusion and stenosis of left carotid artery: Secondary | ICD-10-CM | POA: Diagnosis present

## 2023-04-23 DIAGNOSIS — I6523 Occlusion and stenosis of bilateral carotid arteries: Secondary | ICD-10-CM | POA: Diagnosis not present

## 2023-04-23 NOTE — Progress Notes (Signed)
HISTORY AND PHYSICAL     CC:  follow up. Requesting Provider:  Georgann Housekeeper, MD  HPI: This is a 72 y.o. male here for follow up for carotid artery stenosis.  Pt is s/p left CEA for asymptomatic carotid artery stenosis on 03/07/2017 by Dr. Myra Gianotti.    Pt was last seen 08/15/2021 and at that time and he was doing well without neurological sx. He was compliant with his asa and statin.  Pt returns today for follow up.    Pt denies any amaurosis fugax, speech difficulties, weakness, numbness, paralysis or clumsiness or facial droop.    He states that he has trouble raising his arms up over his head.  He had 2 plates put in his neck about 3 months ago after an event where he couldn't move his neck when he woke up.  He is doing well from this and continues to improve.  He has some knots on the bottom of his feet that have been present for many years.  Painful when he starts to walk but improves with walking.  He does not want referral to podiatry.   He denies any claudication, rest pain or non healing wounds.    The pt is on a statin for cholesterol management.  The pt is on a daily aspirin.   Other AC:  none The pt is on ARB, diuretic for hypertension.   The pt is  on medication for diabetes Tobacco hx:  former  Pt does not have family hx of AAA.  Past Medical History:  Diagnosis Date   Anxiety    was on meds but has been off since ~ 2010   Arthritis    "neck" (03/07/2017)   Carotid artery occlusion    Diabetes mellitus without complication (HCC)    Type 2   Hyperlipidemia    takes Zocor daily   Hypertension    takes Benicar daily   Left carotid stenosis    Neck pain    stenosis   Peripheral vascular disease (HCC)    Carotid Endarterectomy 2018   Weakness    numbness and tingling to the left arm    Past Surgical History:  Procedure Laterality Date   ANTERIOR CERVICAL CORPECTOMY N/A 07/03/2013   Procedure: C/5 Corpectomy w/strut graft + plating;  Surgeon: Tia Alert,  MD;  Location: [facility];  Service: Neurosurgery;  Laterality: N/A;   ANTERIOR CERVICAL DECOMP/DISCECTOMY FUSION N/A 11/24/2022   Procedure: Anterior Cervical Discectomy Fusion-Cervical Three-Cervical Four, Remove Plate Cervical Four-Cervical Six;  Surgeon: Arman Bogus, MD;  Location: [facility];  Service: Neurosurgery;  Laterality: N/A;  3C   BACK SURGERY     CAROTID ENDARTERECTOMY Left 03/07/2017   COLONOSCOPY  2023   COLONOSCOPY WITH PROPOFOL N/A 03/21/2016   Procedure: COLONOSCOPY WITH PROPOFOL;  Surgeon: Charolett Bumpers, MD;  Location: [facility];  Service: Endoscopy;  Laterality: N/A;   ENDARTERECTOMY Left 03/07/2017   Procedure: ENDARTERECTOMY CAROTID LEFT;  Surgeon: Nada Libman, MD;  Location[facility] OR;  Service: Vascular;  Laterality: Left;   INGUINAL HERNIA REPAIR Bilateral    PATCH ANGIOPLASTY Left 03/07/2017   carotid   PATCH ANGIOPLASTY Left 03/07/2017   Procedure: PATCH ANGIOPLASTY LEFT CAROTID ARTERY;  Surgeon: Nada Libman, MD;  Location: [facility] OR;  Service: Vascular;  Laterality: Left;    Allergies  Allergen Reactions   Naproxen Other (See Comments)    States "makes him act out"   Atorvastatin Other (See Comments)   Hydrocodone  All pain meds make him "crazy".   Other Other (See Comments)    A blood pressure pill but unsure of name-gave bad headache A blood pressure pill but unsure of name-gave bad headache   Ramipril Other (See Comments)    Nervous   Sertraline Hcl Other (See Comments)    Unknown    Current Outpatient Medications  Medication Sig Dispense Refill   aspirin EC 81 MG tablet Take 81 mg by mouth daily.     famotidine (PEPCID) 40 MG tablet Take 40 mg by mouth daily.     losartan-hydrochlorothiazide (HYZAAR) 100-12.5 MG tablet Take 1 tablet by mouth daily.     metFORMIN (GLUCOPHAGE) 1000 MG tablet Take 1,000 mg by mouth 2 (two) times daily.     methocarbamol (ROBAXIN) 500 MG tablet Take 1 tablet (500 mg total) by mouth every 6 (six) hours  as needed for muscle spasms. 60 tablet 0   oxyCODONE (OXY IR/ROXICODONE) 5 MG immediate release tablet Take 1 tablet (5 mg total) by mouth every 6 (six) hours as needed for moderate pain ((score 4 to 6)). 20 tablet 0   rosuvastatin (CRESTOR) 10 MG tablet Take 10 mg by mouth at bedtime.     sildenafil (VIAGRA) 100 MG tablet Take 100 mg by mouth daily as needed for erectile dysfunction.     tamsulosin (FLOMAX) 0.4 MG CAPS capsule Take 0.4 mg by mouth daily.     No current facility-administered medications for this visit.    Family History  Problem Relation Age of Onset   Heart disease Mother    Diabetes Mother    Heart disease Father    Hypertension Sister    Diabetes Sister     Social History   Socioeconomic History   Marital status: Married    Spouse name: Not on file   Number of children: Not on file   Years of education: Not on file   Highest education level: Not on file  Occupational History   Not on file  Tobacco Use   Smoking status: Former    Current packs/day: 0.00    Average packs/day: 1.5 packs/day for 20.0 years (30.0 ttl pk-yrs)    Types: Cigarettes    Start date: 03/17/1966    Quit date: 03/17/1986    Years since quitting: 37.1   Smokeless tobacco: Never  Vaping Use   Vaping status: Never Used  Substance and Sexual Activity   Alcohol use: Not Currently    Comment: Occasionally   Drug use: No   Sexual activity: Yes  Other Topics Concern   Not on file  Social History Narrative   Not on file   Social Determinants of Health   Financial Resource Strain: Not on file  Food Insecurity: Not on file  Transportation Needs: Not on file  Physical Activity: Not on file  Stress: Not on file  Social Connections: Not on file  Intimate Partner Violence: Not on file     REVIEW OF SYSTEMS:   [X]  denotes positive finding, [ ]  denotes negative finding Cardiac  Comments:  Chest pain or chest pressure:    Shortness of breath upon exertion:    Short of breath  when lying flat:    Irregular heart rhythm:        Vascular    Pain in calf, thigh, or hip brought on by ambulation:    Pain in feet at night that wakes you up from your sleep:     Blood clot in your veins:  Leg swelling:         Pulmonary    Oxygen at home:    Productive cough:     Wheezing:         Neurologic    Sudden weakness in arms or legs:     Sudden numbness in arms or legs:     Sudden onset of difficulty speaking or slurred speech:    Temporary loss of vision in one eye:     Problems with dizziness:         Gastrointestinal    Blood in stool:     Vomited blood:         Genitourinary    Burning when urinating:     Blood in urine:        Psychiatric    Major depression:         Hematologic    Bleeding problems:    Problems with blood clotting too easily:        Skin    Rashes or ulcers:        Constitutional    Fever or chills:      PHYSICAL EXAMINATION:  Today's Vitals   04/23/23 0850 04/23/23 0852  BP: (!) 146/79 137/73  Pulse: 64   Temp: 98.3 F (36.8 C)   TempSrc: Temporal   SpO2: (!) 47%   Weight: 167 lb 14.4 oz (76.2 kg)   Height: 5\' 9"  (1.753 m)    Body mass index is 24.79 kg/m.   General:  WDWN in NAD; vital signs documented above Gait: Not observed HENT: WNL, normocephalic Pulmonary: normal non-labored breathing Cardiac: regular HR, without carotid bruits Abdomen: soft, NT; aortic pulse is not palpable Skin: without rashes Vascular Exam/Pulses:  Right Left  Radial 2+ (normal) 2+ (normal)  DP 2+ (normal) 2+ (normal)   Extremities: without open wounds Musculoskeletal: no muscle wasting or atrophy  Neurologic: A&O X 3; moving all extremities equally; speech is fluent/normal Psychiatric:  The pt has Normal affect.   Non-Invasive Vascular Imaging:   Carotid Duplex on 04/23/2023 Right:  1-39% ICA stenosis Left:  1-39% ICA stenosis Vertebrals:  Bilateral vertebral arteries demonstrate antegrade flow.  Subclavians: Normal  flow hemodynamics were seen in bilateral subclavian arteries.   Previous Carotid duplex on 08/15/2021: Right: 1-39% ICA stenosis Left:   1-39% ICA stenosis    ASSESSMENT/PLAN:: 72 y.o. male here for follow up carotid artery stenosis and is s/p  left CEA for asymptomatic carotid artery stenosis on 03/07/2017 by Dr. Myra Gianotti.    -duplex today reveals 1-39% bilateral ICA stenosis and he remains asymptomatic.  -discussed s/s of stroke with pt and he understands should he develop any of these sx, he will go to the nearest ER or call 911. -pt will f/u in one year with carotid duplex -pt will call sooner should he have any issues. -continue statin/asa    Doreatha Massed, [facility] [phone]  Clinic MD:  Hetty Blend on call MD

## 2023-04-24 ENCOUNTER — Other Ambulatory Visit: Payer: Self-pay

## 2023-04-24 DIAGNOSIS — I6523 Occlusion and stenosis of bilateral carotid arteries: Secondary | ICD-10-CM

## 2023-11-26 NOTE — Progress Notes (Unsigned)
 Office Note     CC:  follow up Requesting Provider:  Ransom Other, MD  HPI: Gregg Moon is a 73 y.o. (04-14-1951) male who presents for routine follow up of carotid artery stenosis. He has a history of left CEA for asymptomatic carotid artery stenosis on 03/07/2017 by Dr. Serene.    Pt denies any amaurosis fugax. He does say that every once in a while he sees a string across his vision in both eyes. It lasts only a couple seconds and then goes away. He wears glasses and has not had his eyes checked in a couple years. He denies any slurred speech but does say occasionally he has trouble getting his words out. He jokingly says it occurs  when my wife gets on me about something. He denies any weakness or numbness of one side of his body or the other. He does report cold sensation in both of his legs and feet. He denies any pain in them on ambulation or rest. No tissue loss. He says he thinks he has neuropathy because he saw a commercial on TV about it.   He states that he has trouble raising his arms up over his head a year ago. He since 2 plates put in his neck in June of 2024 by Dr. Joshua. He says he is able to raise his arms more but is having chronic pain in his neck. He says he figured this would be resolved by now. He last saw Dr. Joshua about 6 months ago.   The pt is on a statin for cholesterol management.  The pt is on a daily aspirin . Other AC: none The pt is on ARB, diuretic for hypertension.   The pt is  on medication for diabetes Tobacco hx:  former  Past Medical History:  Diagnosis Date   Anxiety    was on meds but has been off since ~ 2010   Arthritis    neck (03/07/2017)   Carotid artery occlusion    Diabetes mellitus without complication (HCC)    Type 2   Hyperlipidemia    takes Zocor  daily   Hypertension    takes Benicar  daily   Left carotid stenosis    Neck pain    stenosis   Peripheral vascular disease (HCC)    Carotid Endarterectomy 2018   Weakness     numbness and tingling to the left arm    Past Surgical History:  Procedure Laterality Date   ANTERIOR CERVICAL CORPECTOMY N/A 07/03/2013   Procedure: C/5 Corpectomy w/strut graft + plating;  Surgeon: Alm GORMAN Joshua, MD;  Location: [facility];  Service: Neurosurgery;  Laterality: N/A;   ANTERIOR CERVICAL DECOMP/DISCECTOMY FUSION N/A 11/24/2022   Procedure: Anterior Cervical Discectomy Fusion-Cervical Three-Cervical Four, Remove Plate Cervical Four-Cervical Six;  Surgeon: Joshua Alm Hamilton, MD;  Location: [facility] OR;  Service: Neurosurgery;  Laterality: N/A;  3C   BACK SURGERY     CAROTID ENDARTERECTOMY Left 03/07/2017   COLONOSCOPY  2023   COLONOSCOPY WITH PROPOFOL  N/A 03/21/2016   Procedure: COLONOSCOPY WITH PROPOFOL ;  Surgeon: Gladis MARLA Louder, MD;  Location: [facility];  Service: Endoscopy;  Laterality: N/A;   ENDARTERECTOMY Left 03/07/2017   Procedure: ENDARTERECTOMY CAROTID LEFT;  Surgeon: Serene Gaile ORN, MD;  Location: [facility] OR;  Service: Vascular;  Laterality: Left;   INGUINAL HERNIA REPAIR Bilateral    PATCH ANGIOPLASTY Left 03/07/2017   carotid   PATCH ANGIOPLASTY Left 03/07/2017   Procedure: PATCH ANGIOPLASTY LEFT CAROTID ARTERY;  Surgeon:  Serene Gaile ORN, MD;  Location: [facility] OR;  Service: Vascular;  Laterality: Left;    Social History   Socioeconomic History   Marital status: Married    Spouse name: Not on file   Number of children: Not on file   Years of education: Not on file   Highest education level: Not on file  Occupational History   Not on file  Tobacco Use   Smoking status: Former    Current packs/day: 0.00    Average packs/day: 1.5 packs/day for 20.0 years (30.0 ttl pk-yrs)    Types: Cigarettes    Start date: 03/17/1966    Quit date: 03/17/1986    Years since quitting: 37.7   Smokeless tobacco: Never  Vaping Use   Vaping status: Never Used  Substance and Sexual Activity   Alcohol use: Not Currently    Comment: Occasionally   Drug use: No   Sexual  activity: Yes  Other Topics Concern   Not on file  Social History Narrative   Not on file   Social Drivers of Health   Financial Resource Strain: Not on file  Food Insecurity: Not on file  Transportation Needs: Not on file  Physical Activity: Not on file  Stress: Not on file  Social Connections: Not on file  Intimate Partner Violence: Not on file   Family History  Problem Relation Age of Onset   Heart disease Mother    Diabetes Mother    Heart disease Father    Hypertension Sister    Diabetes Sister     Current Outpatient Medications  Medication Sig Dispense Refill   aspirin  EC 81 MG tablet Take 81 mg by mouth daily.     famotidine (PEPCID) 40 MG tablet Take 40 mg by mouth daily.     losartan -hydrochlorothiazide  (HYZAAR) 100-12.5 MG tablet Take 1 tablet by mouth daily.     metFORMIN  (GLUCOPHAGE ) 1000 MG tablet Take 1,000 mg by mouth 2 (two) times daily.     methocarbamol  (ROBAXIN ) 500 MG tablet Take 1 tablet (500 mg total) by mouth every 6 (six) hours as needed for muscle spasms. 60 tablet 0   oxyCODONE  (OXY IR/ROXICODONE ) 5 MG immediate release tablet Take 1 tablet (5 mg total) by mouth every 6 (six) hours as needed for moderate pain ((score 4 to 6)). (Patient not taking: Reported on 04/23/2023) 20 tablet 0   rosuvastatin (CRESTOR) 10 MG tablet Take 10 mg by mouth at bedtime.     sildenafil (VIAGRA) 100 MG tablet Take 100 mg by mouth daily as needed for erectile dysfunction.     tamsulosin  (FLOMAX ) 0.4 MG CAPS capsule Take 0.4 mg by mouth daily.     No current facility-administered medications for this visit.    Allergies  Allergen Reactions   Naproxen Other (See Comments)    States makes him act out   Atorvastatin Other (See Comments)   Hydrocodone      All pain meds make him crazy.   Other Other (See Comments)    A blood pressure pill but unsure of name-gave bad headache A blood pressure pill but unsure of name-gave bad headache   Ramipril Other (See Comments)     Nervous   Sertraline Hcl Other (See Comments)    Unknown     REVIEW OF SYSTEMS:  [X]  denotes positive finding, [ ]  denotes negative finding Cardiac  Comments:  Chest pain or chest pressure:    Shortness of breath upon exertion:    Short of breath when lying  flat:    Irregular heart rhythm:        Vascular    Pain in calf, thigh, or hip brought on by ambulation:    Pain in feet at night that wakes you up from your sleep:     Blood clot in your veins:    Leg swelling:         Pulmonary    Oxygen at home:    Productive cough:     Wheezing:         Neurologic    Sudden weakness in arms or legs:     Sudden numbness in arms or legs:     Sudden onset of difficulty speaking or slurred speech:    Temporary loss of vision in one eye:     Problems with dizziness:         Gastrointestinal    Blood in stool:     Vomited blood:         Genitourinary    Burning when urinating:     Blood in urine:        Psychiatric    Major depression:         Hematologic    Bleeding problems:    Problems with blood clotting too easily:        Skin    Rashes or ulcers:        Constitutional    Fever or chills:      PHYSICAL EXAMINATION:  Vitals:   11/27/23 1038 11/27/23 1041  BP: (!) 167/79 138/71  Pulse: 66   Resp: 18   Temp: 98 F (36.7 C)   TempSrc: Temporal   SpO2: 98%   Weight: 163 lb 11.2 oz (74.3 kg)   Height: 5' 9 (1.753 m)     General:  WDWN in NAD; vital signs documented above Gait: Normal HENT: WNL, normocephalic Pulmonary: normal non-labored breathing Cardiac: regular HR Abdomen: soft Vascular Exam/Pulses: 2+ femoral pulses, 2+ DP pulses bilaterally, 2+ radial pulses Extremities: without ischemic changes, without Gangrene , without cellulitis; without open wounds;  Musculoskeletal: no muscle wasting or atrophy  Neurologic: A&O X 3 Psychiatric:  The pt has Normal affect.   Non-Invasive Vascular Imaging:   VAS US  Carotid duplex: Summary:  Right  Carotid: Velocities in the right ICA are consistent with a 1-39% stenosis.   Left Carotid: The carotid endarterectomy site is well visualized demonstrating normal patency with no evidence of significant diameter reduction.   Vertebrals: Bilateral vertebral arteries demonstrate antegrade flow.  Subclavians: Normal flow hemodynamics were seen in bilateral subclavian arteries.    ASSESSMENT/PLAN:: 73 y.o. male here for follow up for carotid stenosis. He has a history of left CEA for asymptomatic carotid artery stenosis on 03/07/2017 by Dr. Serene.  He is without any associated TIA or stroke like symptoms.  - Duplex shows 1-39% right ICA stenosis, left without any stenosis. Normal flow in the vertebral and subclavian arteries - encouraged him to get his eyes checked - He will follow up with Dr. Joshua regarding his continued neck pains - Aspirin  and statin - Follow up in 1 year with carotid duplex   Teretha Damme, PA-C [facility] [phone]  Clinic MD:   Tomie Gaskins

## 2023-11-27 ENCOUNTER — Ambulatory Visit: Attending: Vascular Surgery | Admitting: Physician Assistant

## 2023-11-27 ENCOUNTER — Encounter: Payer: Self-pay | Admitting: Physician Assistant

## 2023-11-27 ENCOUNTER — Ambulatory Visit (HOSPITAL_COMMUNITY)
Admission: RE | Admit: 2023-11-27 | Discharge: 2023-11-27 | Disposition: A | Discharge status: Home / Self Care | Source: Ambulatory Visit | Attending: Vascular Surgery | Admitting: Vascular Surgery

## 2023-11-27 ENCOUNTER — Other Ambulatory Visit: Payer: Self-pay

## 2023-11-27 VITALS — BP 138/71 | HR 66 | Temp 98.0°F | Resp 18 | Ht 69.0 in | Wt 163.7 lb

## 2023-11-27 DIAGNOSIS — I6523 Occlusion and stenosis of bilateral carotid arteries: Secondary | ICD-10-CM | POA: Diagnosis not present

## 2024-02-29 ENCOUNTER — Ambulatory Visit: Admitting: Podiatry

## 2024-02-29 ENCOUNTER — Ambulatory Visit

## 2024-02-29 ENCOUNTER — Encounter: Payer: Self-pay | Admitting: Podiatry

## 2024-02-29 DIAGNOSIS — M7732 Calcaneal spur, left foot: Secondary | ICD-10-CM

## 2024-02-29 DIAGNOSIS — M722 Plantar fascial fibromatosis: Secondary | ICD-10-CM

## 2024-02-29 DIAGNOSIS — M7731 Calcaneal spur, right foot: Secondary | ICD-10-CM

## 2024-02-29 NOTE — Progress Notes (Signed)
 Patient presents with complaint of lumps on the bottom of the feet bilaterally.  Been there for several years.  Have not gotten significantly larger.  Sometimes are little sore.  Has never had great pain with them.  Has not noticed any lumps in his hands.  No family history of fibromas.   Physical exam:  General appearance: Pleasant, and in no acute distress. AOx3.  Vascular: Pedal pulses: DP 2/4 bilaterally, PT 2 to/4 bilaterally.  Mild edema lower legs bilaterally. Capillary fill time immediate bilaterally.  Neurological: Light touch intact feet bilaterally.  Normal Achilles reflex bilaterally.  No clonus or spasticity noted.   Dermatologic:   Skin normal temperature bilaterally.  Skin normal color, tone, and texture bilaterally.   Musculoskeletal: Dense mass is attached and integral to the plantar fascia on the medial mid band plantar fascia bilaterally.  Slight tenderness to palpation on them.  No tenderness at the calcaneus.  Normal muscle strength lower extremity bilaterally.  Hands have no  nodular masses on the palm.  Radiographs: 3 views foot bilaterally: Osteophytic changes plantar and posterior aspect calcaneus.  No ensuing erosive changes.  Normal bone density.  No assenting bone tumors.  No significant soft tissue densities.  Diagnosis: 1.  Plantar fasciitis/fibromas feet bila you got that you sure about that terally 2.  Calcaneal spur bilaterally  Plan: -New patient office visit for evaluation and management level 3. - Discussed plantar fibromas and their etiology and treatment.  Explained that sounds like he is doing well with these for the moment.  Told her they do become more painful or they suddenly enlarge in size we can take a look at them again.  Discussed that they do become painful we can usually treat them without surgical procedures. -Wear well cushioned supportive shoes bilaterally.  Avoid going into bare feet or wearing flat soled shoes.  Return as  needed

## 2024-04-02 ENCOUNTER — Other Ambulatory Visit (INDEPENDENT_AMBULATORY_CARE_PROVIDER_SITE_OTHER)

## 2024-04-02 ENCOUNTER — Ambulatory Visit: Admitting: Orthopaedic Surgery

## 2024-04-02 DIAGNOSIS — M25552 Pain in left hip: Secondary | ICD-10-CM

## 2024-04-02 MED ORDER — METHYLPREDNISOLONE 4 MG PO TBPK: ORAL_TABLET | ORAL | 3 refills | Status: AC

## 2024-04-02 NOTE — Progress Notes (Signed)
 Office Visit Note   Patient: Gregg Moon           Date of Birth: 03/22/1951           MRN: [id number] Visit Date: 04/02/2024              Requested by: Sigrid Deidra Fox, MD [address] [place],  [place] [address] PCP: Sigrid Deidra Fox, MD   Assessment & Plan: Visit Diagnoses:  1. Pain in left hip     Plan: History of Present Illness Gregg Moon is a 73 year old male who presents with left hip pain for two months.  He experiences pain on the lateral side of his left hip without radiation to the groin or buttock. Numbness or tingling extends down his leg past the knee. His leg has given out once in the past week. He has a history of two neck surgeries for a pinched nerve with three plates inserted.  Physical Exam MUSCULOSKELETAL: Hip joint moves well without pain. No tenderness on lateral hip. Hip flexion and rotation without pain. Pain in thigh on resisted hip flexion. Leg lift without radiating pain. No tenderness over trochanteric region.  Results RADIOLOGY Hip X-ray: Mild osteoarthritis  Assessment and Plan Lumbar radiculopathy with lumbar spondylosis Chronic left hip pain likely due to lumbar spine issues, including suspected bulging discs and bone spurs.  Pain likely from a pinched nerve. - Prescribed steroid medication to reduce inflammation. - Referred to physical therapy for symptom management.  Left hip pain Pain localized to lateral left hip, likely referred from back. X-rays show mild arthritis, not symptomatic. - Continue physical therapy for referred pain management.  Follow-Up Instructions: No follow-ups on file.   Orders:  Orders Placed This Encounter  Procedures   XR Lumbar Spine 2-3 Views   XR HIP UNILAT W OR W/O PELVIS 2-3 VIEWS LEFT   Ambulatory referral to Physical Therapy   Meds ordered this encounter  Medications   methylPREDNISolone (MEDROL DOSEPAK) 4 MG TBPK tablet    Sig: Take as directed    Dispense:  21 tablet     Refill:  3      Procedures: No procedures performed   Clinical Data: No additional findings.   Subjective: Chief Complaint  Patient presents with   Left Hip - Pain    HPI  Review of Systems  Constitutional: Negative.   HENT: Negative.    Eyes: Negative.   Respiratory: Negative.    Cardiovascular: Negative.   Gastrointestinal: Negative.   Endocrine: Negative.   Genitourinary: Negative.   Skin: Negative.   Allergic/Immunologic: Negative.   Neurological: Negative.   Hematological: Negative.   Psychiatric/Behavioral: Negative.    All other systems reviewed and are negative.    Objective: Vital Signs: There were no vitals taken for this visit.  Physical Exam Vitals and nursing note reviewed.  Constitutional:      Appearance: He is well-developed.  HENT:     Head: Normocephalic and atraumatic.  Eyes:     Pupils: Pupils are equal, round, and reactive to light.  Pulmonary:     Effort: Pulmonary effort is normal.  Abdominal:     Palpations: Abdomen is soft.  Musculoskeletal:        General: Normal range of motion.     Cervical back: Neck supple.  Skin:    General: Skin is warm.  Neurological:     Mental Status: He is alert and oriented to person, place, and time.  Psychiatric:        Behavior: Behavior normal.        Thought Content: Thought content normal.        Judgment: Judgment normal.     Ortho Exam  Specialty Comments:  No specialty comments available.  Imaging: XR Lumbar Spine 2-3 Views Result Date: 04/02/2024 X-rays of the lumbar spine show straightening of the lumbar spine.  Diffusely degenerative.  Degenerative spurring of the vertebral bodies.  Multilevel facet disease.  XR HIP UNILAT W OR W/O PELVIS 2-3 VIEWS LEFT Result Date: 04/02/2024 Xrays of the pelvis and left hip show no acute or structure abnormalities.    PMFS History: Patient Active Problem List   Diagnosis Date Noted   Diabetes mellitus (HCC) 03/28/2017   Left  carotid artery stenosis 03/07/2017   Pre-syncope 08/01/2016   Spells of decreased attentiveness 08/01/2016   Astigmatism of left eye 07/04/2016   S/P cervical spinal fusion 07/03/2013   Past Medical History:  Diagnosis Date   Anxiety    was on meds but has been off since ~ 2010   Arthritis    neck (03/07/2017)   Carotid artery occlusion    Diabetes mellitus without complication (HCC)    Type 2   Hyperlipidemia    takes Zocor  daily   Hypertension    takes Benicar  daily   Left carotid stenosis    Neck pain    stenosis   Peripheral vascular disease    Carotid Endarterectomy 2018   Weakness    numbness and tingling to the left arm    Family History  Problem Relation Age of Onset   Heart disease Mother    Diabetes Mother    Heart disease Father    Hypertension Sister    Diabetes Sister     Past Surgical History:  Procedure Laterality Date   ANTERIOR CERVICAL CORPECTOMY N/A 07/03/2013   Procedure: C/5 Corpectomy w/strut graft + plating;  Surgeon: Alm GORMAN Molt, MD;  Location: [facility];  Service: Neurosurgery;  Laterality: N/A;   ANTERIOR CERVICAL DECOMP/DISCECTOMY FUSION N/A 11/24/2022   Procedure: Anterior Cervical Discectomy Fusion-Cervical Three-Cervical Four, Remove Plate Cervical Four-Cervical Six;  Surgeon: Molt Alm Hamilton, MD;  Location: [facility] OR;  Service: Neurosurgery;  Laterality: N/A;  3C   BACK SURGERY     CAROTID ENDARTERECTOMY Left 03/07/2017   COLONOSCOPY  2023   COLONOSCOPY WITH PROPOFOL  N/A 03/21/2016   Procedure: COLONOSCOPY WITH PROPOFOL ;  Surgeon: Gladis MARLA Louder, MD;  Location: [facility];  Service: Endoscopy;  Laterality: N/A;   ENDARTERECTOMY Left 03/07/2017   Procedure: ENDARTERECTOMY CAROTID LEFT;  Surgeon: Serene Gaile ORN, MD;  Location: [facility] OR;  Service: Vascular;  Laterality: Left;   INGUINAL HERNIA REPAIR Bilateral    PATCH ANGIOPLASTY Left 03/07/2017   carotid   PATCH ANGIOPLASTY Left 03/07/2017   Procedure: PATCH ANGIOPLASTY LEFT  CAROTID ARTERY;  Surgeon: Serene Gaile ORN, MD;  Location: [facility] OR;  Service: Vascular;  Laterality: Left;   Social History   Occupational History   Not on file  Tobacco Use   Smoking status: Former    Current packs/day: 0.00    Average packs/day: 1.5 packs/day for 20.0 years (30.0 ttl pk-yrs)    Types: Cigarettes    Start date: 03/17/1966    Quit date: 03/17/1986    Years since quitting: 38.0   Smokeless tobacco: Never  Vaping Use   Vaping status: Never Used  Substance and Sexual Activity   Alcohol use: Not Currently  Comment: Occasionally   Drug use: No   Sexual activity: Yes

## 2024-04-08 ENCOUNTER — Ambulatory Visit: Admitting: Orthopaedic Surgery
